# Patient Record
Sex: Female | Born: 1960 | Race: White | Hispanic: No | Marital: Married | Smoking: Former smoker
Health system: Southern US, Community
[De-identification: ages and names within clinical notes are randomized; demographics above are authoritative.]

## PROBLEM LIST (undated history)

## (undated) DIAGNOSIS — K219 Gastro-esophageal reflux disease without esophagitis: Secondary | ICD-10-CM

## (undated) DIAGNOSIS — Z973 Presence of spectacles and contact lenses: Secondary | ICD-10-CM

## (undated) DIAGNOSIS — F419 Anxiety disorder, unspecified: Secondary | ICD-10-CM

## (undated) DIAGNOSIS — E119 Type 2 diabetes mellitus without complications: Secondary | ICD-10-CM

## (undated) DIAGNOSIS — Z972 Presence of dental prosthetic device (complete) (partial): Secondary | ICD-10-CM

## (undated) DIAGNOSIS — E538 Deficiency of other specified B group vitamins: Secondary | ICD-10-CM

## (undated) DIAGNOSIS — M1990 Unspecified osteoarthritis, unspecified site: Secondary | ICD-10-CM

## (undated) DIAGNOSIS — L309 Dermatitis, unspecified: Secondary | ICD-10-CM

## (undated) DIAGNOSIS — M25569 Pain in unspecified knee: Secondary | ICD-10-CM

## (undated) DIAGNOSIS — I10 Essential (primary) hypertension: Secondary | ICD-10-CM

## (undated) DIAGNOSIS — E785 Hyperlipidemia, unspecified: Secondary | ICD-10-CM

## (undated) DIAGNOSIS — E663 Overweight: Secondary | ICD-10-CM

## (undated) HISTORY — DX: Hyperlipidemia, unspecified: E78.5

## (undated) HISTORY — DX: Pain in unspecified knee: M25.569

## (undated) HISTORY — DX: Deficiency of other specified B group vitamins: E53.8

## (undated) HISTORY — DX: Overweight: E66.3

## (undated) HISTORY — DX: Dermatitis, unspecified: L30.9

## (undated) HISTORY — DX: Anxiety disorder, unspecified: F41.9

## (undated) HISTORY — PX: BREAST BIOPSY: SHX20

## (undated) HISTORY — PX: BUNIONECTOMY: SHX129

## (undated) HISTORY — PX: ABDOMINAL HYSTERECTOMY: SHX81

---

## 1988-08-29 HISTORY — PX: PARTIAL HYSTERECTOMY: SHX80

## 2004-08-13 ENCOUNTER — Ambulatory Visit: Payer: Self-pay

## 2004-08-26 ENCOUNTER — Ambulatory Visit: Payer: Self-pay

## 2008-11-19 ENCOUNTER — Emergency Department: Payer: Self-pay | Admitting: Emergency Medicine

## 2010-08-17 ENCOUNTER — Ambulatory Visit: Payer: Self-pay | Admitting: Family Medicine

## 2010-08-27 ENCOUNTER — Encounter: Payer: Self-pay | Admitting: Family Medicine

## 2010-08-29 ENCOUNTER — Encounter: Payer: Self-pay | Admitting: Family Medicine

## 2010-09-01 ENCOUNTER — Ambulatory Visit: Payer: Self-pay | Admitting: Gastroenterology

## 2010-09-01 HISTORY — PX: COLONOSCOPY: SHX174

## 2010-09-01 LAB — HM COLONOSCOPY

## 2011-10-26 ENCOUNTER — Ambulatory Visit: Payer: Self-pay | Admitting: Family Medicine

## 2014-02-10 ENCOUNTER — Ambulatory Visit: Payer: Self-pay | Admitting: Family Medicine

## 2014-02-10 LAB — HM MAMMOGRAPHY: HM MAMMO: NORMAL

## 2014-11-21 LAB — HEMOGLOBIN A1C: HEMOGLOBIN A1C: 7.7 % — AB (ref 4.0–6.0)

## 2015-01-09 LAB — LIPID PANEL
CHOLESTEROL: 158 mg/dL (ref 0–200)
HDL: 30 mg/dL — AB (ref 35–70)
LDL CALC: 62 mg/dL
TRIGLYCERIDES: 312 mg/dL — AB (ref 40–160)

## 2015-01-22 ENCOUNTER — Other Ambulatory Visit: Payer: Self-pay | Admitting: Family Medicine

## 2015-01-22 DIAGNOSIS — R110 Nausea: Secondary | ICD-10-CM

## 2015-01-23 ENCOUNTER — Ambulatory Visit
Admission: RE | Admit: 2015-01-23 | Discharge: 2015-01-23 | Disposition: A | Discharge status: Home / Self Care | Payer: 59 | Source: Ambulatory Visit | Attending: Family Medicine | Admitting: Family Medicine

## 2015-01-23 DIAGNOSIS — R110 Nausea: Secondary | ICD-10-CM | POA: Insufficient documentation

## 2015-01-23 DIAGNOSIS — K760 Fatty (change of) liver, not elsewhere classified: Secondary | ICD-10-CM | POA: Insufficient documentation

## 2015-01-27 ENCOUNTER — Other Ambulatory Visit: Payer: Self-pay | Admitting: Family Medicine

## 2015-01-27 DIAGNOSIS — R140 Abdominal distension (gaseous): Secondary | ICD-10-CM

## 2015-01-28 ENCOUNTER — Other Ambulatory Visit: Payer: Self-pay | Admitting: Family Medicine

## 2015-01-28 DIAGNOSIS — R1010 Upper abdominal pain, unspecified: Secondary | ICD-10-CM

## 2015-01-28 MED ORDER — HYOSCYAMINE SULFATE 0.125 MG SL SUBL: 0.1250 mg | SUBLINGUAL_TABLET | SUBLINGUAL | Status: DC | PRN

## 2015-01-29 ENCOUNTER — Ambulatory Visit
Admission: RE | Admit: 2015-01-29 | Discharge: 2015-01-29 | Disposition: A | Discharge status: Home / Self Care | Payer: 59 | Source: Ambulatory Visit | Attending: Family Medicine | Admitting: Family Medicine

## 2015-01-29 ENCOUNTER — Telehealth: Payer: Self-pay

## 2015-01-29 DIAGNOSIS — I7090 Unspecified atherosclerosis: Secondary | ICD-10-CM | POA: Diagnosis not present

## 2015-01-29 DIAGNOSIS — R140 Abdominal distension (gaseous): Secondary | ICD-10-CM | POA: Diagnosis present

## 2015-01-29 DIAGNOSIS — E8889 Other specified metabolic disorders: Secondary | ICD-10-CM | POA: Insufficient documentation

## 2015-01-29 DIAGNOSIS — K228 Other specified diseases of esophagus: Secondary | ICD-10-CM | POA: Diagnosis not present

## 2015-01-29 HISTORY — DX: Type 2 diabetes mellitus without complications: E11.9

## 2015-01-29 MED ORDER — IOHEXOL 300 MG/ML  SOLN
100.0000 mL | Freq: Once | INTRAMUSCULAR | Status: AC | PRN
  Administered 2015-01-29: 100 mL via INTRAVENOUS

## 2015-01-29 NOTE — Telephone Encounter (Signed)
-----   Message from Steele Sizer, MD sent at 01/29/2015 10:54 AM EDT ----- She has fatty liver and also a thickening of distal esophagus, needs to follow up with GI for EGD. Please notify patient

## 2015-01-29 NOTE — Telephone Encounter (Signed)
Patient notified and CT results and told to follow up with [facility]. Also called in [facility] to [facility] for patient.

## 2015-02-03 ENCOUNTER — Ambulatory Visit: Payer: Self-pay | Admitting: Urgent Care

## 2015-02-03 ENCOUNTER — Ambulatory Visit: Payer: 59

## 2015-02-04 ENCOUNTER — Encounter: Payer: Self-pay | Admitting: Urgent Care

## 2015-02-04 ENCOUNTER — Ambulatory Visit: Payer: Self-pay | Admitting: Surgery

## 2015-02-04 ENCOUNTER — Ambulatory Visit (INDEPENDENT_AMBULATORY_CARE_PROVIDER_SITE_OTHER): Payer: 59 | Admitting: Urgent Care

## 2015-02-04 VITALS — BP 126/74 | HR 83 | Temp 98.1°F | Ht 64.0 in | Wt 159.2 lb

## 2015-02-04 DIAGNOSIS — R110 Nausea: Secondary | ICD-10-CM | POA: Diagnosis not present

## 2015-02-04 DIAGNOSIS — K921 Melena: Secondary | ICD-10-CM | POA: Insufficient documentation

## 2015-02-04 MED ORDER — PANTOPRAZOLE SODIUM 40 MG PO TBEC: 40.0000 mg | DELAYED_RELEASE_TABLET | Freq: Every day | ORAL | Status: DC

## 2015-02-04 MED ORDER — NA SULFATE-K SULFATE-MG SULF 17.5-3.13-1.6 GM/177ML PO SOLN: 1.0000 | ORAL | Status: DC

## 2015-02-04 MED ORDER — ONDANSETRON HCL 4 MG PO TABS: 4.0000 mg | ORAL_TABLET | Freq: Three times a day (TID) | ORAL | Status: DC | PRN

## 2015-02-04 NOTE — Assessment & Plan Note (Addendum)
She has chronic nausea and heartburn. CT suggests changes of the distal esophagus which could be due to acid reflux, esophagitis, Barrett's or malignancy. EGD with Dr. Allen Norris in [place]. I have discussed risks & benefits which include, but are not limited to, bleeding, infection, perforation & drug reaction.  The patient agrees with this plan & written consent will be obtained.    Begin pantoprazole 40 mg daily GERD diet Use Zofran as needed for nausea. Food Choices for Gastroesophageal Reflux Disease handout Take half of your diabetes medications the day prior to the procedure Hold diabetes medications day of procedure Bring all your medications and/or any insulin to the hospital the day of the procedure. Follow blood sugars, call us or your PCP if any problems.

## 2015-02-04 NOTE — Assessment & Plan Note (Signed)
Brandi Jordan is a pleasant 54 y.o. female with intermittent small volume chronic hematochezia. Last colonscopy in 2012.  Colonoscopy with Dr Allen Norris.  Differentials include colorectal carcinoma or polyp,  NSAID-induced enteropathy, inflammatory bowel disease versus benign anorectal source.  I have discussed risks & benefits which include, but are not limited to, bleeding, infection, perforation & drug reaction. The patient agrees with this plan & written consent will be obtained.

## 2015-02-04 NOTE — Progress Notes (Signed)
Gastroenterology Consultation  Referring Provider:     Steele Sizer, MD Primary Care Physician:  Loistine Chance, MD Primary Gastroenterologist:  Dr. Allen Norris     Reason for Consultation:     Chronic nausea, bloating & hematochezia        HPI:   Brandi Jordan is a 54 y.o. y/o female referred for consultation & management of chronic nausea, bloating & hematochezia by Loistine Chance, MD.  Patient states symptoms started about 2 months ago.  Pt is  Diabetic & was on insulin & metformin.  She went to her PCP & started bydureon for 6 weeks.  After 6th week, she had a lot of nausea & constipation. She stopped Bydureon about 4 weeks or more ago.   She tried stool softeners & they helped a little.  She has been on miralax 17grams every morning in her coffee.  Nausea persists.  Denies vomiting.  She has fatigue.  She has mid-abdominal pain that is constant & mild.  She has heartburn & belching with foul rotten egg odor.  Denies dysphgaia or odynophagia.  Only eating soup or crackers due to nausea.  She has loose stools 2-3 per day.  She saw bright red blood with wiping on tissue with BM.  Denies fever.  +chills.  No acute finding of the CT abdomen/ pelvis.  Mild circumferential thickening of the distal esophagus. Recommend correlation with a history of symptoms of GERD, and potentially referral for upper endoscopy.  Steatosis.  Atherosclerosis.  Labs reviewed from 01/09/15 shows a normal CBC, CMP with a glucose of 147. (Normal LFTs) 01/24/14 on phosphatase 125, otherwise normal LFTs, glucose 175, otherwise normal basic metabolic panel. Lipase 48. TSH normal. B12 227.  FA 12.3. Vitamin D 29.2.  Uses phenergan & levsin with some relief.  Occasional TUMS with minimal relief.  Rare Aleve for Headaches.  Past Medical History  Diagnosis Date  . Diabetes mellitus without complication   . Knee pain   . Anxiety   . Dermatitis   . Overweight   . Dyslipidemia   . B12 deficiency     Past Surgical History    Procedure Laterality Date  . Colonoscopy  09/01/2010    Iftikhar-diverticulosis only  . Partial hysterectomy  1990    Partial    Prior to Admission medications   Medication Sig Start Date End Date Taking? Authorizing Provider  Albiglutide 30 MG PEN Inject 1 Units into the skin once a week.    Historical Provider, MD  ALPRAZolam Duanne Moron) 0.5 MG tablet Take 1 tablet by mouth daily as needed. 11/21/14   Historical Provider, MD  aspirin 81 MG tablet Take 81 mg by mouth daily.    Historical Provider, MD  hyoscyamine (LEVSIN SL) 0.125 MG SL tablet Take 1 tablet by mouth as needed. 01/29/15   Historical Provider, MD  metFORMIN (GLUCOPHAGE) 1000 MG tablet Take 1,000 mg by mouth 1 day or 1 dose.    Historical Provider, MD  promethazine (PHENERGAN) 12.5 MG tablet Take 1 tablet by mouth every 6 (six) hours as needed. 01/08/15   Historical Provider, MD    Family History  Problem Relation Age of Onset  . Cholecystitis Mother   . Kidney failure Mother   . Cancer Father     lung  . Leukemia Sister     chronic lymphocytic  . Colon cancer Maternal Aunt 45  . Liver disease Neg Hx     History   Social History Narrative  Lives with husband, 1 healthy grown daughter, works in Therapist, art at [employer]   History  Substance Use Topics  . Smoking status: Not on file  . Smokeless tobacco: Not on file  . Alcohol Use: No    Allergies as of 02/04/2015 - Review Complete 02/04/2015  Allergen Reaction Noted  . Effexor xr [venlafaxine hcl er] Nausea And Vomiting and Other (See Comments) 02/02/2015  . Wilder Glade [dapagliflozin] Other (See Comments) 02/02/2015  . Invokana [canagliflozin] Other (See Comments) 02/02/2015  . Kombiglyze [saxagliptin-metformin er] Diarrhea and Other (See Comments) 02/02/2015  . Amoxicillin Rash 01/29/2015    Review of Systems:    All systems reviewed and negative except where noted in HPI.   Physical Exam:  BP 126/74 mmHg  Pulse 83  Temp(Src) 98.1 F (36.7 C) (Oral)   Ht 5\' 4"  (1.626 m)  Wt 159 lb 3.2 oz (72.213 kg)  BMI 27.31 kg/m2 No LMP recorded. Patient has had a hysterectomy. Psych:  Alert and cooperative. Normal mood and affect. General:   Alert,  Well-developed, well-nourished, pleasant and cooperative in NAD Head:  Normocephalic and atraumatic. Eyes:  Sclera clear, no icterus.   Conjunctiva pink. Ears:  Normal auditory acuity. Nose:  No deformity, discharge, or lesions. Mouth:  No deformity or lesions,oropharynx pink & moist. Neck:  Supple; no masses or thyromegaly. Lungs:  Respirations even and unlabored.  Clear throughout to auscultation.   No wheezes, crackles, or rhonchi. No acute distress. Heart:  Regular rate and rhythm; no murmurs, clicks, rubs, or gallops. Abdomen:  Normal bowel sounds.  No bruits.  Soft, non-tender and non-distended without masses, hepatosplenomegaly or hernias noted.  No guarding or rebound tenderness.  Negative Carnett sign.   Rectal:  Deferred.  Msk:  Symmetrical without gross deformities.  Good, equal movement & strength bilaterally. Pulses:  Normal pulses noted. Extremities:  No clubbing or edema.  No cyanosis. Neurologic:  Alert and oriented x3;  grossly normal neurologically. Skin:  Intact without significant lesions or rashes.  No jaundice. Lymph Nodes:  No significant cervical adenopathy. Psych:  Alert and cooperative. Normal mood and affect.  Imaging Studies: Ct Abdomen Pelvis W Contrast  01/29/2015   CLINICAL DATA:  54 year old female with a history of severe nausea and abdominal cramps.  EXAM: CT ABDOMEN AND PELVIS WITH CONTRAST  TECHNIQUE: Multidetector CT imaging of the abdomen and pelvis was performed using the standard protocol following bolus administration of intravenous contrast.  CONTRAST:  147mL OMNIPAQUE IOHEXOL 300 MG/ML  SOLN  COMPARISON:  None.  FINDINGS: Lower chest:  Unremarkable appearance of the superficial soft tissues.  Visualized heart unremarkable.  No pericardial fluid/thickening.  Mild  circumferential thickening of the distal esophagus. Small hiatal hernia.  No confluent airspace disease.  No pleural effusion.  Abdomen:  Unremarkable appearance of the superficial soft tissues.  Diffusely decreased attenuation of the liver parenchyma. Focal fatty sparing at the gallbladder fossa.  Unremarkable spleen.  Unremarkable adrenal glands.  Unremarkable pancreas.  Unremarkable gallbladder.  Unremarkable bilateral kidneys.  Unremarkable course of the bilateral ureters.  Unremarkable urinary bladder.  No abnormally distended small bowel or colon. No transition point. Normal appendix.  Multiple colonic diverticula.  No associated inflammatory changes.  Surgical changes of hysterectomy. Unremarkable appearance of the right adnexa.  New calcifications of the abdominal aorta. No aneurysm or dissection flap.  No abdominal free fluid, free air, or peritoneal lymphadenopathy.  No displaced fracture. Minimal degenerative changes of the spine. No bony canal narrowing.  Pseudoarticulation of the right L5  transverse process with the sacrum.  IMPRESSION: No acute finding of the abdomen/ pelvis.  Mild circumferential thickening of the distal esophagus. Recommend correlation with a history of symptoms of GERD, and potentially referral for upper endoscopy.  Steatosis.  Atherosclerosis.  Signed,  Dulcy Fanny. Earleen Newport, DO  [facility]  [facility]   Electronically Signed   By: Corrie Mckusick D.O.   On: 01/29/2015 09:15   US Abdomen Limited  01/23/2015   CLINICAL DATA:  Nausea for 3 weeks.  EXAM: US ABDOMEN LIMITED - RIGHT UPPER QUADRANT  COMPARISON:  11/19/2008  FINDINGS: Gallbladder:  No gallstones or wall thickening visualized. No sonographic Murphy sign noted.  Common bile duct:  Diameter: Normal caliber, 5 mm  Liver:  Increased echotexture throughout the liver, similar to prior study compatible with fatty infiltration. No focal abnormality visualized.  IMPRESSION: Stable fatty  liver.  No acute findings.   Electronically Signed   By: Rolm Baptise M.D.   On: 01/23/2015 11:00

## 2015-02-04 NOTE — Patient Instructions (Addendum)
You will need an EGD & colonoscopy with Dr. Allen Norris in [facility] Begin pantoprazole 40 mg daily GERD diet Use Zofran as needed for nausea. Food Choices for Gastroesophageal Reflux Disease When you have gastroesophageal reflux disease (GERD), the foods you eat and your eating habits are very important. Choosing the right foods can help ease the discomfort of GERD. WHAT GENERAL GUIDELINES DO I NEED TO FOLLOW?  Choose fruits, vegetables, whole grains, low-fat dairy products, and low-fat meat, fish, and poultry.  Limit fats such as oils, salad dressings, butter, nuts, and avocado.  Keep a food diary to identify foods that cause symptoms.  Avoid foods that cause reflux. These may be different for different people.  Eat frequent small meals instead of three large meals each day.  Eat your meals slowly, in a relaxed setting.  Limit fried foods.  Cook foods using methods other than frying.  Avoid drinking alcohol.  Avoid drinking large amounts of liquids with your meals.  Avoid bending over or lying down until 2-3 hours after eating. WHAT FOODS ARE NOT RECOMMENDED? The following are some foods and drinks that may worsen your symptoms: Vegetables Tomatoes. Tomato juice. Tomato and spaghetti sauce. Chili peppers. Onion and garlic. Horseradish. Fruits Oranges, grapefruit, and lemon (fruit and juice). Meats High-fat meats, fish, and poultry. This includes hot dogs, ribs, ham, sausage, salami, and bacon. Dairy Whole milk and chocolate milk. Sour cream. Cream. Butter. Ice cream. Cream cheese.  Beverages Coffee and tea, with or without caffeine. Carbonated beverages or energy drinks. Condiments Hot sauce. Barbecue sauce.  Sweets/Desserts Chocolate and cocoa. Donuts. Peppermint and spearmint. Fats and Oils High-fat foods, including [place] fries and potato chips. Other Vinegar. Strong spices, such as black pepper, white pepper, red pepper, cayenne, curry powder, cloves, ginger, and chili  powder. The items listed above may not be a complete list of foods and beverages to avoid. Contact your dietitian for more information. Document Released: 08/15/2005 Document Revised: 08/20/2013 Document Reviewed: 06/19/2013 [facility] Patient Information 2015 [place], [place]. This information is not intended to replace advice given to you by your health care provider. Make sure you discuss any questions you have with your health care provider.

## 2015-02-05 ENCOUNTER — Other Ambulatory Visit: Payer: Self-pay | Admitting: Urgent Care

## 2015-02-13 ENCOUNTER — Telehealth: Payer: Self-pay | Admitting: Urgent Care

## 2015-02-13 NOTE — Telephone Encounter (Signed)
Pt has called stating that she did not get instruction on Colonoscopy or Rx. Please contact pt about instructions. She also has questions about EGD.

## 2015-02-13 NOTE — Telephone Encounter (Signed)
Spoke with patient at this time and she explains that [place] told her that she wanted her to have a Colonscopy as well with planned EGD.  Read through notes that Brennan Bailey had completed night after visit and it confirms that she also wanted to add Colonoscopy on to EGD procedure scheduled 03/03/15.   Will resend appropriate paperwork out to patient. Address verified. Answered all questions while on the phone and verified that suprep was sent to pharmacy.  Pt will call back with any further questions.

## 2015-02-17 ENCOUNTER — Other Ambulatory Visit: Payer: Self-pay | Admitting: Family Medicine

## 2015-02-19 ENCOUNTER — Ambulatory Visit (INDEPENDENT_AMBULATORY_CARE_PROVIDER_SITE_OTHER): Payer: 59

## 2015-02-19 DIAGNOSIS — E538 Deficiency of other specified B group vitamins: Secondary | ICD-10-CM | POA: Diagnosis not present

## 2015-02-19 MED ORDER — CYANOCOBALAMIN 1000 MCG/ML IJ SOLN
1000.0000 ug | Freq: Once | INTRAMUSCULAR | Status: AC
  Administered 2015-02-19: 1000 ug via INTRAMUSCULAR

## 2015-02-19 MED ORDER — CYANOCOBALAMIN 1000 MCG/ML IJ SOLN: 1000.0000 ug | Freq: Once | INTRAMUSCULAR | Status: DC

## 2015-02-23 ENCOUNTER — Other Ambulatory Visit: Payer: Self-pay | Admitting: Urgent Care

## 2015-02-23 ENCOUNTER — Other Ambulatory Visit: Payer: Self-pay

## 2015-03-03 ENCOUNTER — Encounter: Admission: RE | Payer: Self-pay | Source: Ambulatory Visit

## 2015-03-03 ENCOUNTER — Ambulatory Visit: Payer: 59 | Admitting: Anesthesiology

## 2015-03-03 ENCOUNTER — Ambulatory Visit: Admission: RE | Admit: 2015-03-03 | Payer: 59 | Source: Ambulatory Visit | Admitting: Gastroenterology

## 2015-03-03 ENCOUNTER — Ambulatory Visit
Admission: RE | Admit: 2015-03-03 | Discharge: 2015-03-03 | Disposition: A | Discharge status: Home / Self Care | Payer: 59 | Source: Ambulatory Visit | Attending: Gastroenterology | Admitting: Gastroenterology

## 2015-03-03 ENCOUNTER — Encounter
Admission: RE | Disposition: A | Discharge status: Home / Self Care | Payer: Self-pay | Source: Ambulatory Visit | Attending: Gastroenterology

## 2015-03-03 DIAGNOSIS — K2970 Gastritis, unspecified, without bleeding: Secondary | ICD-10-CM

## 2015-03-03 DIAGNOSIS — R110 Nausea: Secondary | ICD-10-CM | POA: Diagnosis not present

## 2015-03-03 DIAGNOSIS — Z888 Allergy status to other drugs, medicaments and biological substances status: Secondary | ICD-10-CM | POA: Insufficient documentation

## 2015-03-03 DIAGNOSIS — Z79899 Other long term (current) drug therapy: Secondary | ICD-10-CM | POA: Insufficient documentation

## 2015-03-03 DIAGNOSIS — Z90710 Acquired absence of both cervix and uterus: Secondary | ICD-10-CM | POA: Diagnosis not present

## 2015-03-03 DIAGNOSIS — Z801 Family history of malignant neoplasm of trachea, bronchus and lung: Secondary | ICD-10-CM | POA: Diagnosis not present

## 2015-03-03 DIAGNOSIS — Z8489 Family history of other specified conditions: Secondary | ICD-10-CM | POA: Diagnosis not present

## 2015-03-03 DIAGNOSIS — Z87891 Personal history of nicotine dependence: Secondary | ICD-10-CM | POA: Diagnosis not present

## 2015-03-03 DIAGNOSIS — Z7982 Long term (current) use of aspirin: Secondary | ICD-10-CM | POA: Insufficient documentation

## 2015-03-03 DIAGNOSIS — E663 Overweight: Secondary | ICD-10-CM | POA: Diagnosis not present

## 2015-03-03 DIAGNOSIS — K640 First degree hemorrhoids: Secondary | ICD-10-CM | POA: Insufficient documentation

## 2015-03-03 DIAGNOSIS — Z6827 Body mass index (BMI) 27.0-27.9, adult: Secondary | ICD-10-CM | POA: Insufficient documentation

## 2015-03-03 DIAGNOSIS — Z800 Family history of malignant neoplasm of digestive organs: Secondary | ICD-10-CM | POA: Insufficient documentation

## 2015-03-03 DIAGNOSIS — E785 Hyperlipidemia, unspecified: Secondary | ICD-10-CM | POA: Insufficient documentation

## 2015-03-03 DIAGNOSIS — Z806 Family history of leukemia: Secondary | ICD-10-CM | POA: Insufficient documentation

## 2015-03-03 DIAGNOSIS — K5790 Diverticulosis of intestine, part unspecified, without perforation or abscess without bleeding: Secondary | ICD-10-CM | POA: Diagnosis not present

## 2015-03-03 DIAGNOSIS — F419 Anxiety disorder, unspecified: Secondary | ICD-10-CM | POA: Insufficient documentation

## 2015-03-03 DIAGNOSIS — K449 Diaphragmatic hernia without obstruction or gangrene: Secondary | ICD-10-CM | POA: Diagnosis not present

## 2015-03-03 DIAGNOSIS — Z841 Family history of disorders of kidney and ureter: Secondary | ICD-10-CM | POA: Diagnosis not present

## 2015-03-03 DIAGNOSIS — E119 Type 2 diabetes mellitus without complications: Secondary | ICD-10-CM | POA: Insufficient documentation

## 2015-03-03 DIAGNOSIS — K921 Melena: Secondary | ICD-10-CM | POA: Diagnosis not present

## 2015-03-03 DIAGNOSIS — Z881 Allergy status to other antibiotic agents status: Secondary | ICD-10-CM | POA: Insufficient documentation

## 2015-03-03 DIAGNOSIS — D122 Benign neoplasm of ascending colon: Secondary | ICD-10-CM

## 2015-03-03 DIAGNOSIS — E538 Deficiency of other specified B group vitamins: Secondary | ICD-10-CM | POA: Diagnosis not present

## 2015-03-03 DIAGNOSIS — K2950 Unspecified chronic gastritis without bleeding: Secondary | ICD-10-CM | POA: Diagnosis not present

## 2015-03-03 HISTORY — PX: ESOPHAGOGASTRODUODENOSCOPY (EGD) WITH PROPOFOL: SHX5813

## 2015-03-03 HISTORY — PX: COLONOSCOPY WITH PROPOFOL: SHX5780

## 2015-03-03 LAB — GLUCOSE, CAPILLARY: GLUCOSE-CAPILLARY: 190 mg/dL — AB (ref 65–99)

## 2015-03-03 SURGERY — COLONOSCOPY WITH PROPOFOL
Anesthesia: Choice

## 2015-03-03 SURGERY — ESOPHAGOGASTRODUODENOSCOPY (EGD) WITH PROPOFOL
Anesthesia: General

## 2015-03-03 SURGERY — ESOPHAGOGASTRODUODENOSCOPY (EGD) WITH PROPOFOL
Anesthesia: Choice

## 2015-03-03 SURGERY — COLONOSCOPY WITH PROPOFOL
Anesthesia: General

## 2015-03-03 MED ORDER — LIDOCAINE HCL (CARDIAC) 20 MG/ML IV SOLN
INTRAVENOUS | Status: DC | PRN
  Administered 2015-03-03: 100 mg via INTRAVENOUS

## 2015-03-03 MED ORDER — SODIUM CHLORIDE 0.9 % IV SOLN
INTRAVENOUS | Status: DC
  Administered 2015-03-03: 1000 mL via INTRAVENOUS

## 2015-03-03 MED ORDER — MIDAZOLAM HCL 2 MG/2ML IJ SOLN
INTRAMUSCULAR | Status: DC | PRN
  Administered 2015-03-03: 2 mg via INTRAVENOUS

## 2015-03-03 MED ORDER — PROPOFOL INFUSION 10 MG/ML OPTIME
INTRAVENOUS | Status: DC | PRN
  Administered 2015-03-03: 160 ug/kg/min via INTRAVENOUS

## 2015-03-03 NOTE — Anesthesia Postprocedure Evaluation (Signed)
  Anesthesia Post-op Note  Patient: Brandi Jordan  Procedure(s) Performed: Procedure(s): COLONOSCOPY WITH PROPOFOL (N/A) ESOPHAGOGASTRODUODENOSCOPY (EGD) WITH PROPOFOL (N/A)  Anesthesia type:General  Patient location: PACU  Post pain: Pain level controlled  Post assessment: Post-op Vital signs reviewed, Patient's Cardiovascular Status Stable, Respiratory Function Stable, Patent Airway and No signs of Nausea or vomiting  Post vital signs: Reviewed and stable  Last Vitals:  Filed Vitals:   03/03/15 0923  BP: 137/84  Pulse: 86  Temp: 37 C  Resp: 17    Level of consciousness: awake, alert  and patient cooperative  Complications: No apparent anesthesia complications

## 2015-03-03 NOTE — Transfer of Care (Signed)
Immediate Anesthesia Transfer of Care Note  Patient: Brandi Jordan  Procedure(s) Performed: Procedure(s): COLONOSCOPY WITH PROPOFOL (N/A) ESOPHAGOGASTRODUODENOSCOPY (EGD) WITH PROPOFOL (N/A)  Patient Location: PACU and Endoscopy Unit  Anesthesia Type:General  Level of Consciousness: awake, alert  and oriented  Airway & Oxygen Therapy: Patient Spontanous Breathing and Patient connected to nasal cannula oxygen  Post-op Assessment: Report given to RN and Post -op Vital signs reviewed and stable  Post vital signs: Reviewed and stable  Last Vitals: 99% 88hr 98/69 20r  97 temp Filed Vitals:   03/03/15 0923  BP: 137/84  Pulse: 86  Temp: 37 C  Resp: 17    Complications: No apparent anesthesia complications

## 2015-03-03 NOTE — Anesthesia Preprocedure Evaluation (Signed)
Anesthesia Evaluation  Patient identified by MRN, date of birth, ID band Patient awake    Reviewed: Allergy & Precautions, NPO status , Patient's Chart, lab work & pertinent test results  Airway Mallampati: III  TM Distance: <3 FB Neck ROM: Full    Dental  (+) Caps   Pulmonary former smoker,  breath sounds clear to auscultation  Pulmonary exam normal       Cardiovascular negative cardio ROS Normal cardiovascular exam    Neuro/Psych Anxiety negative neurological ROS     GI/Hepatic Neg liver ROS, hematochezia   Endo/Other  diabetes, Well Controlled, Type 2  Renal/GU negative Renal ROS  negative genitourinary   Musculoskeletal negative musculoskeletal ROS (+)   Abdominal Normal abdominal exam  (+)   Peds negative pediatric ROS (+)  Hematology hematochezia   Anesthesia Other Findings   Reproductive/Obstetrics                             Anesthesia Physical Anesthesia Plan  ASA: II  Anesthesia Plan: General   Post-op Pain Management:    Induction: Intravenous  Airway Management Planned: Nasal Cannula  Additional Equipment:   Intra-op Plan:   Post-operative Plan:   Informed Consent: I have reviewed the patients History and Physical, chart, labs and discussed the procedure including the risks, benefits and alternatives for the proposed anesthesia with the patient or authorized representative who has indicated his/her understanding and acceptance.   Dental advisory given  Plan Discussed with: CRNA and Surgeon  Anesthesia Plan Comments:         Anesthesia Quick Evaluation

## 2015-03-03 NOTE — Op Note (Signed)
[facility]astroenterology Patient Name: Brandi Jordan Procedure Date: 03/03/2015 10:07 AM MRN: [id number] Account #: [id number] Date of Birth: Feb 27, 1961 Admit Type: Outpatient Age: 54 Room: [facility] ENDO ROOM 4 Gender: Female Note Status: Finalized Procedure:         Upper GI endoscopy Indications:       Nausea Providers:         Lucilla Lame, MD Referring MD:      Bethena Roys. Sowles, MD (Referring MD) Medicines:         Propofol per Anesthesia Complications:     No immediate complications. Procedure:         Pre-Anesthesia Assessment:                    - Prior to the procedure, a History and Physical was                     performed, and patient medications and allergies were                     reviewed. The patient's tolerance of previous anesthesia                     was also reviewed. The risks and benefits of the procedure                     and the sedation options and risks were discussed with the                     patient. All questions were answered, and informed consent                     was obtained. Prior Anticoagulants: The patient has taken                     no previous anticoagulant or antiplatelet agents. ASA                     Grade Assessment: II - A patient with mild systemic                     disease. After reviewing the risks and benefits, the                     patient was deemed in satisfactory condition to undergo                     the procedure.                    After obtaining informed consent, the endoscope was passed                     under direct vision. Throughout the procedure, the                     patient's blood pressure, pulse, and oxygen saturations                     were monitored continuously. The Olympus GIF-140 endoscope                     (S#: [id number]) was introduced through the mouth, and  advanced to the second part of duodenum. The upper GI                     endoscopy was  accomplished without difficulty. The patient                     tolerated the procedure well. Findings:      A small hiatus hernia was present.      Localized minimal inflammation characterized by erythema was found in       the gastric antrum. Biopsies were taken with a cold forceps for       histology.      The examined duodenum was normal. Impression:        - Small hiatus hernia.                    - Gastritis. Biopsied.                    - Normal examined duodenum. Recommendation:    - Await pathology results.                    - Perform a colonoscopy today. Procedure Code(s): --- Professional ---                    [phone], Esophagogastroduodenoscopy, flexible, transoral;                     with biopsy, single or multiple Diagnosis Code(s): --- Professional ---                    R11.0, Nausea                    K29.70, Gastritis, unspecified, without bleeding CPT copyright 2014 American Medical Association. All rights reserved. The codes documented in this report are preliminary and upon coder review may  be revised to meet current compliance requirements. Lucilla Lame, MD 03/03/2015 10:16:15 AM This report has been signed electronically. Number of Addenda: 0 Note Initiated On: 03/03/2015 10:07 AM      [facility]

## 2015-03-03 NOTE — H&P (Signed)
[facility]  [address] [place], [place] [address] Phone: [phone] Fax : [phone]  Primary Care Physician:  Loistine Chance, MD Primary Gastroenterologist:  Dr. Allen Norris  Pre-Procedure History & Physical: HPI:  Brandi Jordan is a 54 y.o. female is here for an endoscopy and colonoscopy.   Past Medical History  Diagnosis Date  . Diabetes mellitus without complication   . Knee pain   . Anxiety   . Dermatitis   . Overweight   . Dyslipidemia   . B12 deficiency     Past Surgical History  Procedure Laterality Date  . Colonoscopy  09/01/2010    Iftikhar-diverticulosis only  . Partial hysterectomy  1990    Partial  . Abdominal hysterectomy      Prior to Admission medications   Medication Sig Start Date End Date Taking? Authorizing Provider  metFORMIN (GLUCOPHAGE) 1000 MG tablet Take 1,000 mg by mouth 1 day or 1 dose.   Yes Historical Provider, MD  Na Sulfate-K Sulfate-Mg Sulf (SUPREP BOWEL PREP) SOLN Take 1 kit by mouth as directed. 02/04/15  Yes Andria Meuse, NP  ALPRAZolam Duanne Moron) 0.5 MG tablet Take 1 tablet by mouth daily as needed. 11/21/14   Historical Provider, MD  aspirin 81 MG tablet Take 81 mg by mouth daily.    Historical Provider, MD  hyoscyamine (LEVSIN SL) 0.125 MG SL tablet Take 1 tablet by mouth as needed. 01/29/15   Historical Provider, MD  metformin (FORTAMET) 1000 MG (OSM) 24 hr tablet Take 1 tablet by mouth  every evening 02/17/15   Steele Sizer, MD  ondansetron (ZOFRAN) 4 MG tablet Take 1 tablet (4 mg total) by mouth every 8 (eight) hours as needed for nausea or vomiting. 02/04/15   Andria Meuse, NP  pantoprazole (PROTONIX) 40 MG tablet Take 1 tablet (40 mg total) by mouth daily. 02/04/15   Andria Meuse, NP  promethazine (PHENERGAN) 12.5 MG tablet Take 1 tablet by mouth every 6 (six) hours as needed. 01/08/15   Historical Provider, MD    Allergies as of 02/23/2015 - Review Complete 02/04/2015  Allergen Reaction Noted  . Effexor  xr [venlafaxine hcl er] Nausea And Vomiting and Other (See Comments) 02/02/2015  . Wilder Glade [dapagliflozin] Other (See Comments) 02/02/2015  . Invokana [canagliflozin] Other (See Comments) 02/02/2015  . Kombiglyze [saxagliptin-metformin er] Diarrhea and Other (See Comments) 02/02/2015  . Amoxicillin Rash 01/29/2015    Family History  Problem Relation Age of Onset  . Cholecystitis Mother   . Kidney failure Mother   . Cancer Father     lung  . Leukemia Sister     chronic lymphocytic  . Colon cancer Maternal Aunt 66  . Liver disease Neg Hx     History   Social History  . Marital Status: Married    Spouse Name: N/A  . Number of Children: 1  . Years of Education: N/A   Occupational History  . customer service [employer] History Main Topics  . Smoking status: Former Smoker    Quit date: 06/03/2010  . Smokeless tobacco: Not on file  . Alcohol Use: No  . Drug Use: No  . Sexual Activity: Not on file   Other Topics Concern  . 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 Neck:  Supple; no masses or thyromegaly. Lungs:  Clear throughout to auscultation.    Heart:  Regular rate and rhythm. Abdomen:  Soft, nontender and nondistended. Normal bowel sounds, without guarding, and without rebound.   Neurologic:  Alert and  oriented x4;  grossly normal neurologically.  Impression/Plan: Michelene Gardener is here for an endoscopy and colonoscopy to be performed for nausea and hematochezia  Risks, benefits, limitations, and alternatives regarding  endoscopy and colonoscopy have been reviewed with  the patient.  Questions have been answered.  All parties agreeable.   Ollen Bowl, MD  03/03/2015, 10:04 AM

## 2015-03-03 NOTE — Op Note (Signed)
[facility] Gastroenterology Patient Name: Brandi Jordan Procedure Date: 03/03/2015 10:06 AM MRN: [id number] Account #: [id number] Date of Birth: [birth year] Admit Type: Outpatient Age: 54 Room: [facility] ENDO ROOM 4 Gender: Female Note Status: Finalized Procedure:         Colonoscopy Indications:       Hematochezia Providers:         Lucilla Lame, MD Referring MD:      Bethena Roys. Sowles, MD (Referring MD) Medicines:         Propofol per Anesthesia Complications:     No immediate complications. Procedure:         Pre-Anesthesia Assessment:                    - Prior to the procedure, a History and Physical was                     performed, and patient medications and allergies were                     reviewed. The patient's tolerance of previous anesthesia                     was also reviewed. The risks and benefits of the procedure                     and the sedation options and risks were discussed with the                     patient. All questions were answered, and informed consent                     was obtained. Prior Anticoagulants: The patient has taken                     no previous anticoagulant or antiplatelet agents. ASA                     Grade Assessment: II - A patient with mild systemic                     disease. After reviewing the risks and benefits, the                     patient was deemed in satisfactory condition to undergo                     the procedure.                    After obtaining informed consent, the colonoscope was                     passed under direct vision. Throughout the procedure, the                     patient's blood pressure, pulse, and oxygen saturations                     were monitored continuously. The Colonoscope was                     introduced through the anus and advanced to the the cecum,  identified by appendiceal orifice and ileocecal valve. The                     colonoscopy was  performed without difficulty. The patient                     tolerated the procedure well. The quality of the bowel                     preparation was excellent. Findings:      The perianal and digital rectal examinations were normal.      A 3 mm polyp was found in the ascending colon. The polyp was sessile.       The polyp was removed with a cold biopsy forceps. Resection and       retrieval were complete.      Non-bleeding internal hemorrhoids were found during retroflexion. The       hemorrhoids were Grade I (internal hemorrhoids that do not prolapse).      A few small-mouthed diverticula were found in the entire colon. Impression:        - One 3 mm polyp in the ascending colon. Resected and                     retrieved.                    - Non-bleeding internal hemorrhoids.                    - Diverticulosis in the entire examined colon. Recommendation:    - High fiber diet. Procedure Code(s): --- Professional ---                    [id number], Colonoscopy, flexible; with biopsy, single or                     multiple Diagnosis Code(s): --- Professional ---                    K92.1, Melena                    D12.2, Benign neoplasm of ascending colon CPT copyright 2014 American Medical Association. All rights reserved. The codes documented in this report are preliminary and upon coder review may  be revised to meet current compliance requirements. Lucilla Lame, MD 03/03/2015 10:34:58 AM This report has been signed electronically. Number of Addenda: 0 Note Initiated On: 03/03/2015 10:06 AM Scope Withdrawal Time: 0 hours 10 minutes 15 seconds  Total Procedure Duration: 0 hours 12 minutes 23 seconds       [facility]

## 2015-03-04 ENCOUNTER — Encounter: Payer: Self-pay | Admitting: Gastroenterology

## 2015-03-04 LAB — SURGICAL PATHOLOGY

## 2015-03-05 ENCOUNTER — Ambulatory Visit (INDEPENDENT_AMBULATORY_CARE_PROVIDER_SITE_OTHER): Payer: 59 | Admitting: Family Medicine

## 2015-03-05 ENCOUNTER — Encounter: Payer: Self-pay | Admitting: Family Medicine

## 2015-03-05 ENCOUNTER — Encounter (INDEPENDENT_AMBULATORY_CARE_PROVIDER_SITE_OTHER): Payer: Self-pay

## 2015-03-05 VITALS — BP 110/64 | HR 80 | Temp 99.0°F | Resp 14 | Ht 64.0 in | Wt 162.5 lb

## 2015-03-05 DIAGNOSIS — Z1239 Encounter for other screening for malignant neoplasm of breast: Secondary | ICD-10-CM

## 2015-03-05 DIAGNOSIS — F411 Generalized anxiety disorder: Secondary | ICD-10-CM | POA: Insufficient documentation

## 2015-03-05 DIAGNOSIS — E538 Deficiency of other specified B group vitamins: Secondary | ICD-10-CM | POA: Insufficient documentation

## 2015-03-05 DIAGNOSIS — E1165 Type 2 diabetes mellitus with hyperglycemia: Secondary | ICD-10-CM

## 2015-03-05 DIAGNOSIS — K648 Other hemorrhoids: Secondary | ICD-10-CM | POA: Insufficient documentation

## 2015-03-05 DIAGNOSIS — Z0000 Encounter for general adult medical examination without abnormal findings: Secondary | ICD-10-CM | POA: Diagnosis not present

## 2015-03-05 DIAGNOSIS — Z124 Encounter for screening for malignant neoplasm of cervix: Secondary | ICD-10-CM | POA: Diagnosis not present

## 2015-03-05 DIAGNOSIS — Z1211 Encounter for screening for malignant neoplasm of colon: Secondary | ICD-10-CM

## 2015-03-05 DIAGNOSIS — Z01419 Encounter for gynecological examination (general) (routine) without abnormal findings: Secondary | ICD-10-CM

## 2015-03-05 DIAGNOSIS — K2970 Gastritis, unspecified, without bleeding: Secondary | ICD-10-CM

## 2015-03-05 DIAGNOSIS — IMO0002 Reserved for concepts with insufficient information to code with codable children: Secondary | ICD-10-CM | POA: Insufficient documentation

## 2015-03-05 DIAGNOSIS — E785 Hyperlipidemia, unspecified: Secondary | ICD-10-CM | POA: Insufficient documentation

## 2015-03-05 DIAGNOSIS — E1140 Type 2 diabetes mellitus with diabetic neuropathy, unspecified: Secondary | ICD-10-CM | POA: Insufficient documentation

## 2015-03-05 MED ORDER — PANTOPRAZOLE SODIUM 40 MG PO TBEC: 40.0000 mg | DELAYED_RELEASE_TABLET | Freq: Every day | ORAL | Status: DC

## 2015-03-05 NOTE — Progress Notes (Signed)
Name: Brandi Jordan   MRN: [id number]    DOB: January 28, 1961   Date:03/05/2015       Progress Note  Subjective  Chief Complaint  Chief Complaint  Patient presents with  . Annual Exam    HPI  Well Woman exam: she is feeling better, had colonoscopy and EGD this week, still has some abdominal bloating, otherwise feeling fine. Appetite is back to normal. DMII, she never started Tanzeum , she is taking metformin only and glucose has been high since colonoscopy. She states occasionally has urge incontinence. She has not been sexually active because of husband's back pain.   Patient Active Problem List   Diagnosis Date Noted  . Internal hemorrhoids without complication 54/00/8676  . Diabetes type 2, controlled 03/05/2015  . Anxiety, generalized 03/05/2015  . B12 deficiency 03/05/2015  . Hyperlipidemia 03/05/2015  . Gastritis   . Benign neoplasm of ascending colon     Past Surgical History  Procedure Laterality Date  . Colonoscopy  09/01/2010    Iftikhar-diverticulosis only  . Partial hysterectomy  1990    Partial  . Abdominal hysterectomy    . Colonoscopy with propofol N/A 03/03/2015    Procedure: COLONOSCOPY WITH PROPOFOL;  Surgeon: Lucilla Lame, MD;  Location: [facility] ENDOSCOPY;  Service: Endoscopy;  Laterality: N/A;  . Esophagogastroduodenoscopy (egd) with propofol N/A 03/03/2015    Procedure: ESOPHAGOGASTRODUODENOSCOPY (EGD) WITH PROPOFOL;  Surgeon: Lucilla Lame, MD;  Location: [facility] ENDOSCOPY;  Service: Endoscopy;  Laterality: N/A;    Family History  Problem Relation Age of Onset  . Cholecystitis Mother   . Kidney failure Mother   . Cancer Father     lung  . Leukemia Sister     chronic lymphocytic  . Colon cancer Maternal Aunt 20  . Liver disease Neg Hx     History   Social History  . Marital Status: Married    Spouse Name: N/A  . Number of Children: 1  . Years of Education: N/A   Occupational History  . 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 Smoking  status: Former Smoker -- 35 years    Types: Cigarettes    Quit date: 06/03/2010  . Smokeless tobacco: Not on file  . Alcohol Use: No  . Drug Use: No  . Sexual Activity: Not Currently   Other Topics Concern  . 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  ALPRAZolam (XANAX) 0.5 MG tablet, Take 1 tablet by mouth daily as needed., Disp: , Rfl:  .  aspirin 81 MG tablet, Take 81 mg by mouth daily., Disp: , Rfl:  .  metformin (FORTAMET) 1000 MG (OSM) 24 hr tablet, Take 1 tablet by mouth  every evening, Disp: 90 tablet, Rfl: 0 .  pantoprazole (PROTONIX) 40 MG tablet, Take 1 tablet (40 mg total) by mouth daily., Disp: 90 tablet, Rfl: 0  Allergies  Allergen Reactions  . Effexor Xr [Venlafaxine Hcl Er] Nausea And Vomiting and Other (See Comments)    "felt weird"  . Wilder Glade [Dapagliflozin] Other (See Comments)    headache  . Invokana [Canagliflozin] Other (See Comments)    vaginitis  . Kombiglyze [Saxagliptin-Metformin Er] Diarrhea and Other (See Comments)    dizziness  . Amoxicillin Rash     ROS  Constitutional: Negative for fever or weight change.  Respiratory: Negative for cough and shortness of  breath.   Cardiovascular: Negative for chest pain or palpitations.  Gastrointestinal: Negative for abdominal pain, no bowel changes.  Musculoskeletal: Negative for gait problem or joint swelling.  Skin: Negative for rash.  Neurological: Negative for dizziness or headache.  No other specific complaints in a complete review of systems (except as listed in HPI above).  Objective  Filed Vitals:   03/05/15 0850  BP: 110/64  Pulse: 80  Temp: 99 F (37.2 C)  TempSrc: Oral  Resp: 14  Height: 5\' 4"  (1.626 m)  Weight: 162 lb 8 oz (73.71 kg)  SpO2: 97%    Body mass index is 27.88 kg/(m^2).  Physical Exam Constitutional: Patient appears well-developed and  well-nourished. No distress.  HENT: Head: Normocephalic and atraumatic. Ears: B TMs ok, no erythema or effusion; Nose: Nose normal. Mouth/Throat: Oropharynx is clear and moist. No oropharyngeal exudate.  Eyes: Conjunctivae and EOM are normal. Pupils are equal, round, and reactive to light. No scleral icterus.  Neck: Normal range of motion. Neck supple. No JVD present. No thyromegaly present.  Cardiovascular: Normal rate, regular rhythm and normal heart sounds.  No murmur heard. No BLE edema. Pulmonary/Chest: Effort normal and breath sounds normal. No respiratory distress. Abdominal: Soft. Bowel sounds are normal, no distension. There is no tenderness. no masses Breast: no lumps or masses, no nipple discharge or rashes FEMALE GENITALIA:  External genitalia normal External urethra normal Bimanual exam normal without masses RECTAL: not done Musculoskeletal: Normal range of motion, no joint effusions. No gross deformities Neurological: he is alert and oriented to person, place, and time. No cranial nerve deficit. Coordination, balance, strength, speech and gait are normal.  Skin: Skin is warm and dry. No rash noted. No erythema.  Psychiatric: Patient has a normal mood and affect. behavior is normal. Judgment and thought content normal.   Recent Results (from the past 2160 hour(s))  Glucose, capillary     Status: Abnormal   Collection Time: 03/03/15  9:39 AM  Result Value Ref Range   Glucose-Capillary 190 (H) 65 - 99 mg/dL  Surgical pathology     Status: None   Collection Time: 03/03/15 10:14 AM  Result Value Ref Range   SURGICAL PATHOLOGY      Surgical Pathology CASE: [id number] PATIENT: Brandi Jordan Surgical Pathology Report     SPECIMEN SUBMITTED: A. Stomach, antrum, cbx B. Colon polyp, ascending, cbx  CLINICAL HISTORY: None provided  PRE-OPERATIVE DIAGNOSIS: Chronic nausea, Hematochezia  POST-OPERATIVE DIAGNOSIS: hiatal hernia, Gastritis, colon  polyp     DIAGNOSIS: A. STOMACH, ANTRUM; COLD BIOPSY: - ANTRAL MUCOSA WITH MINIMAL CHRONIC GASTRITIS AND MILD FOVEOLAR HYPERPLASIA. - NEGATIVE FOR H. PYLORI, DYSPLASIA AND MALIGNANCY.  B. COLON POLYP, ASCENDING; COLD BIOPSY: - TUBULAR ADENOMA. - NEGATIVE FOR HIGH-GRADE DYSPLASIA AND MALIGNANCY.     GROSS DESCRIPTION:  A. Labeled: C biopsy antrum Tissue Fragment(s): 2 Measurement: 0.4-0.5 cm Comment: Tan  Entirely submitted in cassette(s): 1  B. Labeled: C biopsy polyp AC Tissue Fragment(s): 1 Measurement: 0.15 cm Comment: Tan  Entirely submitted in cassette(s): 1     Final Diagnosis performed by Delorse Lek, MD.  Electronical ly signed 03/04/2015 1:33:12PM    The electronic signature indicates that the named Attending Pathologist has evaluated the specimen  Technical component performed at [facility], [address] [place], [place] [address] Lab: [phone] Dir: Darrick Penna. Evette Doffing, MD  Professional component performed at [facility], [facility], [place], [place],  [address] Lab: [phone] Dir: Dellia Nims. Reuel Derby, MD  PHQ2/9: Depression screen PHQ 2/9 03/05/2015  Decreased Interest 0  Down, Depressed, Hopeless 0  PHQ - 2 Score 0     Fall Risk: Fall Risk  03/05/2015  Falls in the past year? No    Assessment & Plan  1. Well woman exam Discussed importance of 150 minutes of physical activity weekly, eat two servings of fish weekly, eat one serving of tree nuts ( cashews, pistachios, pecans, almonds.Marland Kitchen) every other day, eat 6 servings of fruit/vegetables daily and drink plenty of water and avoid sweet beverages.   She will check coverage for shingles with her insurance  2. Breast cancer screening  - MM Digital Screening; Future  3. Gastritis  - pantoprazole (PROTONIX) 40 MG tablet; Take 1 tablet (40 mg total) by mouth daily.  Dispense: 90 tablet; Refill: 0  4. Colon cancer screening Up to date  58. Cervical  cancer screening No need , s/p hysterectomy

## 2015-03-06 ENCOUNTER — Telehealth: Payer: Self-pay

## 2015-03-06 NOTE — Telephone Encounter (Signed)
Pt notified. Added to recall list.

## 2015-03-06 NOTE — Telephone Encounter (Signed)
-----   Message from Lucilla Lame, MD sent at 03/05/2015 12:43 PM EDT ----- That the patient know that the stomach did not show any infection and only minimal inflammation. The colon showed a precancerous polyp in the patient will need a repeat colonoscopy in 5 years. No source for the patient's nausea was seen.

## 2015-03-12 ENCOUNTER — Ambulatory Visit (INDEPENDENT_AMBULATORY_CARE_PROVIDER_SITE_OTHER): Payer: 59 | Admitting: Family Medicine

## 2015-03-12 ENCOUNTER — Encounter: Payer: Self-pay | Admitting: Family Medicine

## 2015-03-12 VITALS — BP 126/72 | HR 90 | Temp 98.6°F | Resp 18 | Ht 64.0 in | Wt 163.4 lb

## 2015-03-12 DIAGNOSIS — E785 Hyperlipidemia, unspecified: Secondary | ICD-10-CM

## 2015-03-12 DIAGNOSIS — E538 Deficiency of other specified B group vitamins: Secondary | ICD-10-CM

## 2015-03-12 DIAGNOSIS — F411 Generalized anxiety disorder: Secondary | ICD-10-CM

## 2015-03-12 DIAGNOSIS — K2970 Gastritis, unspecified, without bleeding: Secondary | ICD-10-CM | POA: Diagnosis not present

## 2015-03-12 DIAGNOSIS — E1340 Other specified diabetes mellitus with diabetic neuropathy, unspecified: Secondary | ICD-10-CM

## 2015-03-12 DIAGNOSIS — E1342 Other specified diabetes mellitus with diabetic polyneuropathy: Secondary | ICD-10-CM

## 2015-03-12 LAB — POCT GLYCOSYLATED HEMOGLOBIN (HGB A1C): HEMOGLOBIN A1C: 6.3

## 2015-03-12 MED ORDER — ALBIGLUTIDE 30 MG ~~LOC~~ PEN: 30.0000 mg | PEN_INJECTOR | SUBCUTANEOUS | Status: DC

## 2015-03-12 MED ORDER — METFORMIN HCL ER (OSM) 1000 MG PO TB24
1000.0000 mg | ORAL_TABLET | Freq: Every evening | ORAL | Status: DC
Start: 2015-03-12 — End: 2015-06-30

## 2015-03-12 MED ORDER — ALPRAZOLAM 0.5 MG PO TABS: 0.5000 mg | ORAL_TABLET | Freq: Every day | ORAL | Status: DC | PRN

## 2015-03-12 NOTE — Progress Notes (Signed)
Name: Brandi Jordan   MRN: [id number]    DOB: Apr 24, 1961   Date:03/12/2015       Progress Note  Subjective  Chief Complaint  Chief Complaint  Patient presents with  . Medication Refill    3 month F/U  . Diabetes    checks BS once daily, Low-99 Average-145 High-191  . Anxiety    unchanged    HPI  DMII: she has been taking Metformin 1000 ER, the pills is very big and hard to swallow but she has been able to tolerate it.  FSBS has been around 145 and hgbA1C today is at goal, it was 7.7 when last checked . Over the past week glucose is going up to 180's fasting. HgbA1C may have improved because she was nauseated because of gastritis and is now eating again.  Anxiety: she takes Alprazolam qhs to help control her anxiety, and help her sleep, she states it seems to control her symptoms.  Gastritis: she was taking Protonix daily, but is waiting for mail order to deliver, currently on otc Prilosec, but has noticed some upper abdominal cramping, mild, nausea has decreased also.   Patient Active Problem List   Diagnosis Date Noted  . Internal hemorrhoids without complication 17/49/4496  . Secondary diabetes with peripheral neuropathy 03/05/2015  . Anxiety, generalized 03/05/2015  . B12 deficiency 03/05/2015  . Hyperlipidemia 03/05/2015  . Gastritis   . Benign neoplasm of ascending colon     Past Surgical History  Procedure Laterality Date  . Colonoscopy  09/01/2010    Iftikhar-diverticulosis only  . Partial hysterectomy  1990    Partial  . Abdominal hysterectomy    . Colonoscopy with propofol N/A 03/03/2015    Procedure: COLONOSCOPY WITH PROPOFOL;  Surgeon: Lucilla Lame, MD;  Location: [facility] ENDOSCOPY;  Service: Endoscopy;  Laterality: N/A;  . Esophagogastroduodenoscopy (egd) with propofol N/A 03/03/2015    Procedure: ESOPHAGOGASTRODUODENOSCOPY (EGD) WITH PROPOFOL;  Surgeon: Lucilla Lame, MD;  Location: [facility] ENDOSCOPY;  Service: Endoscopy;  Laterality: N/A;    Family History  Problem  Relation Age of Onset  . Cholecystitis Mother   . Kidney failure Mother   . Hypothyroidism Mother   . Cancer Father     lung  . Leukemia Sister     chronic lymphocytic  . Colon cancer Maternal Aunt 47  . Liver disease Neg Hx   . Hypothyroidism Daughter   . Cancer Maternal Aunt     Breast    History   Social History  . Marital Status: Married    Spouse Name: N/A  . Number of Children: 1  . Years of Education: N/A   Occupational History  . 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 Smoking status: Former Smoker -- 35 years    Types: Cigarettes    Quit date: 06/03/2010  . Smokeless tobacco: Not on file  . Alcohol Use: No  . Drug Use: No  . Sexual Activity: Not Currently   Other Topics Concern  . 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  ALPRAZolam (XANAX) 0.5 MG tablet, Take 1 tablet (0.5 mg total) by mouth daily as needed., Disp: 90 tablet, Rfl: 0 .  aspirin 81 MG tablet, Take 81 mg by mouth daily., Disp: , Rfl:  .  metformin (FORTAMET) 1000 MG (OSM) 24 hr tablet, Take 1 tablet (1,000  mg total) by mouth every evening., Disp: 90 tablet, Rfl: 1 .  pantoprazole (PROTONIX) 40 MG tablet, Take 1 tablet (40 mg total) by mouth daily., Disp: 90 tablet, Rfl: 0  Allergies  Allergen Reactions  . Effexor Xr [Venlafaxine Hcl Er] Nausea And Vomiting and Other (See Comments)    "felt weird"  . Wilder Glade [Dapagliflozin] Other (See Comments)    headache  . Invokana [Canagliflozin] Other (See Comments)    vaginitis  . Kombiglyze [Saxagliptin-Metformin Er] Diarrhea and Other (See Comments)    dizziness  . Amoxicillin Rash     ROS  Constitutional: Negative for fever or weight change.  Respiratory: Negative for cough and shortness of breath.   Cardiovascular: Negative for chest pain or palpitations.  Gastrointestinal: Negative for  abdominal pain, no bowel changes.  Musculoskeletal: Negative for gait problem or joint swelling.  Skin: Negative for rash.  Neurological: Negative for dizziness or headache.  No other specific complaints in a complete review of systems (except as listed in HPI above).  Objective  Filed Vitals:   03/12/15 0929  BP: 126/72  Pulse: 90  Temp: 98.6 F (37 C)  TempSrc: Oral  Resp: 18  Height: 5\' 4"  (1.626 m)  Weight: 163 lb 6.4 oz (74.118 kg)  SpO2: 98%    Body mass index is 28.03 kg/(m^2).  Physical Exam  Constitutional: Patient appears well-developed and well-nourished. No distress.  Eyes:  No scleral icterus. PERL Neck: Normal range of motion. Neck supple. No thyromegaly Cardiovascular: Normal rate, regular rhythm and normal heart sounds.  No murmur heard. No BLE edema. Pulmonary/Chest: Effort normal and breath sounds normal. No respiratory distress. Abdominal: Soft.  There is no tenderness. Psychiatric: Patient has a normal mood and affect. behavior is normal. Judgment and thought content normal.  Recent Results (from the past 2160 hour(s))  Glucose, capillary     Status: Abnormal   Collection Time: 03/03/15  9:39 AM  Result Value Ref Range   Glucose-Capillary 190 (H) 65 - 99 mg/dL  Surgical pathology     Status: None   Collection Time: 03/03/15 10:14 AM  Result Value Ref Range   SURGICAL PATHOLOGY      Surgical Pathology CASE: [id number] PATIENT: Taylyn Bouley Surgical Pathology Report     SPECIMEN SUBMITTED: A. Stomach, antrum, cbx B. Colon polyp, ascending, cbx  CLINICAL HISTORY: None provided  PRE-OPERATIVE DIAGNOSIS: Chronic nausea, Hematochezia  POST-OPERATIVE DIAGNOSIS: hiatal hernia, Gastritis, colon polyp     DIAGNOSIS: A. STOMACH, ANTRUM; COLD BIOPSY: - ANTRAL MUCOSA WITH MINIMAL CHRONIC GASTRITIS AND MILD FOVEOLAR HYPERPLASIA. - NEGATIVE FOR H. PYLORI, DYSPLASIA AND MALIGNANCY.  B. COLON POLYP, ASCENDING; COLD BIOPSY: - TUBULAR  ADENOMA. - NEGATIVE FOR HIGH-GRADE DYSPLASIA AND MALIGNANCY.     GROSS DESCRIPTION:  A. Labeled: C biopsy antrum Tissue Fragment(s): 2 Measurement: 0.4-0.5 cm Comment: Tan  Entirely submitted in cassette(s): 1  B. Labeled: C biopsy polyp AC Tissue Fragment(s): 1 Measurement: 0.15 cm Comment: Tan  Entirely submitted in cassette(s): 1     Final Diagnosis performed by Delorse Lek, MD.  Electronical ly signed 03/04/2015 1:33:12PM    The electronic signature indicates that the named Attending Pathologist has evaluated the specimen  Technical component performed at [place], [address], [place], [place] [address] Lab: [phone] Dir: Darrick Penna. Evette Doffing, MD  Professional component performed at [facility], [facility], [place], [place],  [address] Lab: [phone] Dir: Dellia Nims. Rubinas, MD    POCT HgB A1C     Status: None  Collection Time: 03/12/15  9:35 AM  Result Value Ref Range   Hemoglobin A1C 6.3      PHQ2/9: Depression screen PHQ 2/9 03/05/2015  Decreased Interest 0  Down, Depressed, Hopeless 0  PHQ - 2 Score 0     Fall Risk: Fall Risk  03/05/2015  Falls in the past year? No      Assessment & Plan  1. Secondary diabetes with peripheral neuropathy hgbA1C is at goal, but was not eating, now glucose is climbing up again, advised to try sample of Tanzeium - POCT HgB A1C - metformin (FORTAMET) 1000 MG (OSM) 24 hr tablet; Take 1 tablet (1,000 mg total) by mouth every evening.  Dispense: 90 tablet; Refill: 1 - Albiglutide (TANZEUM) 30 MG PEN; Inject 30 mg into the skin once a week.  Dispense: 4 each; Refill: 0  2. Anxiety, generalized Stable with medication - ALPRAZolam (XANAX) 0.5 MG tablet; Take 1 tablet (0.5 mg total) by mouth daily as needed.  Dispense: 90 tablet; Refill: 0  3. Gastritis Resume Protonix and eat bland diet  4. B12 deficiency Continue monthly injetions  5. Hyperlipidemia Not fasting on her labs,  she is working on dietary modification , recheck next visit

## 2015-03-12 NOTE — Patient Instructions (Signed)
Lipid panel shows low HDL : to improve HDL patient  needs to eat tree nuts ( pecans/pistachios/almonds ) four times weekly, eat fish two times weekly  and exercise  at least 150 minutes per week

## 2015-03-13 LAB — HM DIABETES EYE EXAM

## 2015-03-18 ENCOUNTER — Other Ambulatory Visit: Payer: Self-pay

## 2015-03-18 MED ORDER — FLUCONAZOLE 150 MG PO TABS: 150.0000 mg | ORAL_TABLET | Freq: Once | ORAL | Status: DC

## 2015-03-18 NOTE — Telephone Encounter (Signed)
Pt states is having a lot of irritation and itching want to get refill on diflucan?

## 2015-03-19 ENCOUNTER — Ambulatory Visit (INDEPENDENT_AMBULATORY_CARE_PROVIDER_SITE_OTHER): Payer: 59

## 2015-03-19 DIAGNOSIS — E538 Deficiency of other specified B group vitamins: Secondary | ICD-10-CM

## 2015-03-19 MED ORDER — CYANOCOBALAMIN 1000 MCG/ML IJ SOLN
1000.0000 ug | Freq: Once | INTRAMUSCULAR | Status: AC
  Administered 2015-03-19: 1000 ug via INTRAMUSCULAR

## 2015-04-06 ENCOUNTER — Telehealth: Payer: Self-pay | Admitting: Family Medicine

## 2015-04-06 MED ORDER — FLUCONAZOLE 150 MG PO TABS: 150.0000 mg | ORAL_TABLET | ORAL | Status: DC

## 2015-04-06 NOTE — Telephone Encounter (Signed)
Sent Diflucan for vaginal irritation, diarrhea unlikely to be from Tanzeum. She needs to stay hydrated

## 2015-04-06 NOTE — Telephone Encounter (Signed)
Patient states is having a consistent raw and itching feeling on the outside of her vaginal area since Wednesday or Thursday. Patient is Hydrocortisone 10 cream with some help.  She also just started Tanzeum yesterday and now having diarrhea.

## 2015-04-06 NOTE — Telephone Encounter (Signed)
Pt would like a call back

## 2015-04-07 NOTE — Telephone Encounter (Signed)
Pt.notified

## 2015-04-23 ENCOUNTER — Other Ambulatory Visit: Payer: Self-pay | Admitting: Family Medicine

## 2015-04-27 ENCOUNTER — Ambulatory Visit (INDEPENDENT_AMBULATORY_CARE_PROVIDER_SITE_OTHER): Payer: 59

## 2015-04-27 DIAGNOSIS — E538 Deficiency of other specified B group vitamins: Secondary | ICD-10-CM | POA: Diagnosis not present

## 2015-04-27 MED ORDER — CYANOCOBALAMIN 1000 MCG/ML IJ SOLN
1000.0000 ug | Freq: Once | INTRAMUSCULAR | Status: AC
  Administered 2015-04-27: 1000 ug via INTRAMUSCULAR

## 2015-05-11 ENCOUNTER — Encounter: Payer: Self-pay | Admitting: Family Medicine

## 2015-05-11 DIAGNOSIS — M6530 Trigger finger, unspecified finger: Secondary | ICD-10-CM | POA: Insufficient documentation

## 2015-05-12 ENCOUNTER — Telehealth: Payer: Self-pay | Admitting: Gastroenterology

## 2015-05-12 NOTE — Telephone Encounter (Signed)
Patient called and said her stomach is still burning and taking the protonics still. Is there anything else she can do? Please advise.

## 2015-05-13 ENCOUNTER — Encounter: Payer: Self-pay | Admitting: Family Medicine

## 2015-05-20 ENCOUNTER — Ambulatory Visit (INDEPENDENT_AMBULATORY_CARE_PROVIDER_SITE_OTHER): Payer: 59 | Admitting: Family Medicine

## 2015-05-20 ENCOUNTER — Encounter: Payer: Self-pay | Admitting: Family Medicine

## 2015-05-20 VITALS — BP 124/80 | HR 91 | Temp 98.7°F | Resp 16 | Ht 64.0 in | Wt 162.6 lb

## 2015-05-20 DIAGNOSIS — Z79899 Other long term (current) drug therapy: Secondary | ICD-10-CM | POA: Diagnosis not present

## 2015-05-20 DIAGNOSIS — Z23 Encounter for immunization: Secondary | ICD-10-CM

## 2015-05-20 DIAGNOSIS — E1340 Other specified diabetes mellitus with diabetic neuropathy, unspecified: Secondary | ICD-10-CM | POA: Diagnosis not present

## 2015-05-20 DIAGNOSIS — N760 Acute vaginitis: Secondary | ICD-10-CM | POA: Diagnosis not present

## 2015-05-20 DIAGNOSIS — K449 Diaphragmatic hernia without obstruction or gangrene: Secondary | ICD-10-CM | POA: Diagnosis not present

## 2015-05-20 DIAGNOSIS — Z1159 Encounter for screening for other viral diseases: Secondary | ICD-10-CM | POA: Diagnosis not present

## 2015-05-20 DIAGNOSIS — E785 Hyperlipidemia, unspecified: Secondary | ICD-10-CM

## 2015-05-20 DIAGNOSIS — F4321 Adjustment disorder with depressed mood: Secondary | ICD-10-CM | POA: Diagnosis not present

## 2015-05-20 DIAGNOSIS — F4329 Adjustment disorder with other symptoms: Secondary | ICD-10-CM

## 2015-05-20 DIAGNOSIS — E1342 Other specified diabetes mellitus with diabetic polyneuropathy: Secondary | ICD-10-CM

## 2015-05-20 DIAGNOSIS — K2970 Gastritis, unspecified, without bleeding: Secondary | ICD-10-CM | POA: Diagnosis not present

## 2015-05-20 DIAGNOSIS — E538 Deficiency of other specified B group vitamins: Secondary | ICD-10-CM

## 2015-05-20 DIAGNOSIS — I700 Atherosclerosis of aorta: Secondary | ICD-10-CM

## 2015-05-20 DIAGNOSIS — K760 Fatty (change of) liver, not elsewhere classified: Secondary | ICD-10-CM | POA: Diagnosis not present

## 2015-05-20 DIAGNOSIS — Z634 Disappearance and death of family member: Secondary | ICD-10-CM

## 2015-05-20 MED ORDER — FLUCONAZOLE 150 MG PO TABS: 150.0000 mg | ORAL_TABLET | ORAL | Status: DC

## 2015-05-20 MED ORDER — OMEPRAZOLE-SODIUM BICARBONATE 40-1100 MG PO CAPS: 1.0000 | ORAL_CAPSULE | Freq: Every day | ORAL | Status: DC

## 2015-05-20 MED ORDER — HYOSCYAMINE SULFATE ER 0.375 MG PO TB12: 0.3750 mg | ORAL_TABLET | Freq: Two times a day (BID) | ORAL | Status: DC | PRN

## 2015-05-20 MED ORDER — ONDANSETRON HCL 4 MG PO TABS: 4.0000 mg | ORAL_TABLET | Freq: Three times a day (TID) | ORAL | Status: DC | PRN

## 2015-05-20 NOTE — Progress Notes (Signed)
Name: Brandi Jordan   MRN: [id number]    DOB: Sep 22, 1960   Date:05/20/2015       Progress Note  Subjective  Chief Complaint  Chief Complaint  Patient presents with  . Abdominal Pain    epi gastric pain with nausea and some vomiting, pt described as cramping feeling.  Comes and goes and is tired wants answers  . Vaginal Itching    ongoing off and on burning sensation    HPI  Gastritis/Hiatal hernia: she has been having symptoms of epigastric pain and nausea with bloating going on for about 9 months. She has had multiple test. Including [place] , CT scan and EGD/colonoscopy.  So far it has shown: fatty liver, gastritis and hiatal hernia. She was given Protonix by Dr. Durwin Reges a couple of months ago but symptoms have not improved, except for Levsin that helps with nausea.  Nausea has been daily and pain is cramping like and is on upper abdomen, at times pain is burning like. She is tired of feeling sick.   Recurrent Vaginitis: states the only way symptoms are controlled is when she takes Diflucan weekly. No vaginal discharge but has burning and itching sensation in her vagina.  Grieving: lost her mother in March 1950 - she had complications of renal failure and CVA. Her mother was her best friend, she is still grieving, crying multiple times weekly, still has a void inside of her. She is back to normal ADL and work. She is now getting ready to sale her house - the house that she grew up in.   DMII: taking medication as prescribed. Glucose is in the 180's fasting. She is not very compliant with her diet.   Dyslipidemia: low HDL, and high triglycerides. She has calcified abdominal aorta on x-ray , but does not want to take statin at this time   Patient Active Problem List   Diagnosis Date Noted  . Fatty liver 05/20/2015  . Calcification of aorta 05/20/2015  . Recurrent vaginitis 05/20/2015  . Hiatal hernia 05/20/2015  . Trigger finger 05/11/2015  . Internal hemorrhoids without complication  [id number]/[id number]  . Secondary diabetes with peripheral neuropathy 03/05/2015  . Anxiety, generalized 03/05/2015  . B12 deficiency 03/05/2015  . Hyperlipidemia 03/05/2015  . Gastritis   . Benign neoplasm of ascending colon     Past Surgical History  Procedure Laterality Date  . Colonoscopy  09/01/2010    Iftikhar-diverticulosis only  . Partial hysterectomy  1990    Partial  . Abdominal hysterectomy    . Colonoscopy with propofol N/A 03/03/2015    Procedure: COLONOSCOPY WITH PROPOFOL;  Surgeon: Lucilla Lame, MD;  Location: [facility] ENDOSCOPY;  Service: Endoscopy;  Laterality: N/A;  . Esophagogastroduodenoscopy (egd) with propofol N/A 03/03/2015    Procedure: ESOPHAGOGASTRODUODENOSCOPY (EGD) WITH PROPOFOL;  Surgeon: Lucilla Lame, MD;  Location: [facility] ENDOSCOPY;  Service: Endoscopy;  Laterality: N/A;    Family History  Problem Relation Age of Onset  . Cholecystitis Mother   . Kidney failure Mother   . Hypothyroidism Mother   . Cancer Father     lung  . Leukemia Sister     chronic lymphocytic  . Colon cancer Maternal Aunt 26  . Liver disease Neg Hx   . Hypothyroidism Daughter   . Cancer Maternal Aunt     Breast    Social History   Social History  . Marital Status: Married    Spouse Name: N/A  . Number of Children: 1  . Years of Education: N/A  Occupational History  . customer service [employer] History Main Topics  . Smoking status: Former Smoker -- 35 years    Types: Cigarettes    Quit date: 06/03/2010  . Smokeless tobacco: Not on file  . Alcohol Use: No  . Drug Use: No  . Sexual Activity: Not Currently   Other Topics Concern  . Not on file   Social History Narrative   Lives with husband, 1 healthy grown daughter, works in Therapist, art at [employer] outpatient prescriptions:  .  Albiglutide (TANZEUM) 30 MG PEN, Inject 30 mg into the skin once a week., Disp: 4 each, Rfl: 0 .  ALPRAZolam (XANAX) 0.5 MG tablet, Take 1 tablet (0.5 mg total) by mouth  daily as needed., Disp: 90 tablet, Rfl: 0 .  aspirin 81 MG tablet, Take 81 mg by mouth daily., Disp: , Rfl:  .  fluconazole (DIFLUCAN) 150 MG tablet, Take 1 tablet (150 mg total) by mouth once a week., Disp: 12 tablet, Rfl: 0 .  hyoscyamine (LEVBID) 0.375 MG 12 hr tablet, Take 1 tablet (0.375 mg total) by mouth every 12 (twelve) hours as needed., Disp: 180 tablet, Rfl: 0 .  metformin (FORTAMET) 1000 MG (OSM) 24 hr tablet, Take 1 tablet (1,000 mg total) by mouth every evening., Disp: 90 tablet, Rfl: 1 .  omeprazole-sodium bicarbonate (ZEGERID) 40-1100 MG per capsule, Take 1 capsule by mouth daily before breakfast., Disp: 30 capsule, Rfl: 0 .  ondansetron (ZOFRAN) 4 MG tablet, Take 1 tablet (4 mg total) by mouth every 8 (eight) hours as needed for nausea or vomiting., Disp: 20 tablet, Rfl: 0  Allergies  Allergen Reactions  . Effexor Xr [Venlafaxine Hcl Er] Nausea And Vomiting and Other (See Comments)    "felt weird"  . Wilder Glade [Dapagliflozin] Other (See Comments)    headache  . Invokana [Canagliflozin] Other (See Comments)    vaginitis  . Kombiglyze [Saxagliptin-Metformin Er] Diarrhea and Other (See Comments)    dizziness  . Amoxicillin Rash     ROS  Constitutional: Negative for fever or weight change.  Respiratory: Negative for cough and shortness of breath.   Cardiovascular: Negative for chest pain or palpitations.  Gastrointestinal: Negative for abdominal pain, no bowel changes.  Musculoskeletal: Negative for gait problem or joint swelling.  Skin: Negative for rash.  Neurological: Negative for dizziness or headache.  No other specific complaints in a complete review of systems (except as listed in HPI above).  Objective  Filed Vitals:   05/20/15 1142  BP: 124/80  Pulse: 91  Temp: 98.7 F (37.1 C)  TempSrc: Oral  Resp: 16  Height: 5\' 4"  (1.626 m)  Weight: 162 lb 9.6 oz (73.755 kg)  SpO2: 97%    Body mass index is 27.9 kg/(m^2).  Physical Exam  Constitutional:  Patient appears well-developed and well-nourished. Obese No distress.  HEENT: head atraumatic, normocephalic, pupils equal and reactive to light,  neck supple, throat within normal limits Cardiovascular: Normal rate, regular rhythm and normal heart sounds.  No murmur heard. No BLE edema. Pulmonary/Chest: Effort normal and breath sounds normal. No respiratory distress. Abdominal: Soft.  There is no tenderness. Psychiatric: Patient has a normal mood and affect. behavior is normal. Judgment and thought content normal.  Recent Results (from the past 2160 hour(s))  Glucose, capillary     Status: Abnormal   Collection Time: 03/03/15  9:39 AM  Result Value Ref Range   Glucose-Capillary 190 (H) 65 - 99 mg/dL  Surgical  pathology     Status: None   Collection Time: 03/03/15 10:14 AM  Result Value Ref Range   SURGICAL PATHOLOGY      Surgical Pathology CASE: [id number] PATIENT: Len Blitzer Surgical Pathology Report     SPECIMEN SUBMITTED: A. Stomach, antrum, cbx B. Colon polyp, ascending, cbx  CLINICAL HISTORY: None provided  PRE-OPERATIVE DIAGNOSIS: Chronic nausea, Hematochezia  POST-OPERATIVE DIAGNOSIS: hiatal hernia, Gastritis, colon polyp     DIAGNOSIS: A. STOMACH, ANTRUM; COLD BIOPSY: - ANTRAL MUCOSA WITH MINIMAL CHRONIC GASTRITIS AND MILD FOVEOLAR HYPERPLASIA. - NEGATIVE FOR H. PYLORI, DYSPLASIA AND MALIGNANCY.  B. COLON POLYP, ASCENDING; COLD BIOPSY: - TUBULAR ADENOMA. - NEGATIVE FOR HIGH-GRADE DYSPLASIA AND MALIGNANCY.     GROSS DESCRIPTION:  A. Labeled: C biopsy antrum Tissue Fragment(s): 2 Measurement: 0.4-0.5 cm Comment: Tan  Entirely submitted in cassette(s): 1  B. Labeled: C biopsy polyp AC Tissue Fragment(s): 1 Measurement: 0.15 cm Comment: Tan  Entirely submitted in cassette(s): 1     Final Diagnosis performed by Delorse Lek, MD.  Electronical ly signed 03/04/2015 1:33:12PM    The electronic signature indicates that the named Attending  Pathologist has evaluated the specimen  Technical component performed at [facility], [address] [place], [place] [address] Lab: [phone] Dir: Darrick Penna. Evette Doffing, MD  Professional component performed at [facility], [facility], [place], [place], [place] [address] Lab: [phone] Dir: Dellia Nims. Rubinas, MD    POCT HgB A1C     Status: None   Collection Time: 03/12/15  9:35 AM  Result Value Ref Range   Hemoglobin A1C 6.3    PHQ2/9: Depression screen [facility] 2/9 05/20/2015 03/05/2015  Decreased Interest 0 0  Down, Depressed, Hopeless 0 0  PHQ - 2 Score 0 0    Fall Risk: Fall Risk  05/20/2015 03/05/2015  Falls in the past year? No No      Functional Status Survey: Is the patient deaf or have difficulty hearing?: No Does the patient have difficulty seeing, even when wearing glasses/contacts?: Yes (glasses) Does the patient have difficulty concentrating, remembering, or making decisions?: No Does the patient have difficulty walking or climbing stairs?: No Does the patient have difficulty dressing or bathing?: No Does the patient have difficulty doing errands alone such as visiting a doctor's office or shopping?: No    Assessment & Plan  1. Fatty liver  She has a follow up with Dr. Durwin Reges in one month, and discussed with patient that she may need to have a liver biopsy.   2. Needs flu shot  - Flu Vaccine QUAD 36+ mos PF IM (Fluarix & Fluzone Quad PF)  3. Calcification of aorta  She does not want to start statin at this time  4. B12 deficiency  She is getting monthly B12  5. Secondary diabetes with peripheral neuropathy  Discussed importance of following a diabetic diet, recheck labs prior to next visit so we can adjust medication accordingly.  - Hemoglobin A1c  6. Hyperlipidemia  - Lipid panel  7. Recurrent vaginitis  Diflucan weekly  8. Complicated grieving  She still misses her mother, tearful today, does not want medication at this  time  48. Gastritis  Change from Protonix to Zegerid since insurance does not pay for Dexilant  10. Hiatal hernia   11. Long-term use of high-risk medication  - Comprehensive metabolic panel  12. Need for hepatitis C screening test  - Hepatitis C antibody

## 2015-05-27 ENCOUNTER — Ambulatory Visit
Admission: RE | Admit: 2015-05-27 | Discharge: 2015-05-27 | Disposition: A | Discharge status: Home / Self Care | Payer: 59 | Source: Ambulatory Visit | Attending: Family Medicine | Admitting: Family Medicine

## 2015-05-27 DIAGNOSIS — Z1231 Encounter for screening mammogram for malignant neoplasm of breast: Secondary | ICD-10-CM | POA: Diagnosis present

## 2015-05-27 DIAGNOSIS — Z1239 Encounter for other screening for malignant neoplasm of breast: Secondary | ICD-10-CM

## 2015-05-28 ENCOUNTER — Encounter: Payer: Self-pay | Admitting: Family Medicine

## 2015-06-05 NOTE — Telephone Encounter (Signed)
Has appt 10/24.

## 2015-06-15 ENCOUNTER — Ambulatory Visit: Payer: 59 | Admitting: Family Medicine

## 2015-06-15 ENCOUNTER — Ambulatory Visit: Payer: 59 | Admitting: Gastroenterology

## 2015-06-15 ENCOUNTER — Encounter: Payer: Self-pay | Admitting: Family Medicine

## 2015-06-20 LAB — LIPID PANEL
CHOL/HDL RATIO: 7.2 ratio — AB (ref 0.0–4.4)
CHOLESTEROL TOTAL: 186 mg/dL (ref 100–199)
HDL: 26 mg/dL — ABNORMAL LOW (ref >39)
TRIGLYCERIDES: 403 mg/dL — AB (ref 0–149)

## 2015-06-20 LAB — COMPREHENSIVE METABOLIC PANEL
A/G RATIO: 2 (ref 1.1–2.5)
ALBUMIN: 4.7 g/dL (ref 3.5–5.5)
ALK PHOS: 137 IU/L — AB (ref 39–117)
ALT: 27 IU/L (ref 0–32)
AST: 19 IU/L (ref 0–40)
BILIRUBIN TOTAL: 0.4 mg/dL (ref 0.0–1.2)
BUN / CREAT RATIO: 19 (ref 9–23)
BUN: 15 mg/dL (ref 6–24)
CHLORIDE: 95 mmol/L — AB (ref 97–106)
CO2: 22 mmol/L (ref 18–29)
Calcium: 9.9 mg/dL (ref 8.7–10.2)
Creatinine, Ser: 0.78 mg/dL (ref 0.57–1.00)
GFR calc non Af Amer: 85 mL/min/{1.73_m2} (ref >59)
GFR, EST AFRICAN AMERICAN: 98 mL/min/{1.73_m2} (ref >59)
GLOBULIN, TOTAL: 2.4 g/dL (ref 1.5–4.5)
Glucose: 200 mg/dL — ABNORMAL HIGH (ref 65–99)
POTASSIUM: 4.4 mmol/L (ref 3.5–5.2)
SODIUM: 137 mmol/L (ref 136–144)
TOTAL PROTEIN: 7.1 g/dL (ref 6.0–8.5)

## 2015-06-20 LAB — HEMOGLOBIN A1C
Est. average glucose Bld gHb Est-mCnc: 197 mg/dL
Hgb A1c MFr Bld: 8.5 % — ABNORMAL HIGH (ref 4.8–5.6)

## 2015-06-20 LAB — HEPATITIS C ANTIBODY

## 2015-06-21 ENCOUNTER — Other Ambulatory Visit: Payer: Self-pay | Admitting: Family Medicine

## 2015-06-21 DIAGNOSIS — E1342 Other specified diabetes mellitus with diabetic polyneuropathy: Secondary | ICD-10-CM

## 2015-06-21 MED ORDER — ATORVASTATIN CALCIUM 40 MG PO TABS: 40.0000 mg | ORAL_TABLET | Freq: Every day | ORAL | Status: DC

## 2015-06-21 MED ORDER — ALBIGLUTIDE 50 MG ~~LOC~~ PEN: 50.0000 mg | PEN_INJECTOR | SUBCUTANEOUS | Status: DC

## 2015-06-22 ENCOUNTER — Ambulatory Visit (INDEPENDENT_AMBULATORY_CARE_PROVIDER_SITE_OTHER): Payer: 59 | Admitting: Gastroenterology

## 2015-06-22 ENCOUNTER — Other Ambulatory Visit: Payer: Self-pay

## 2015-06-22 ENCOUNTER — Encounter: Payer: Self-pay | Admitting: Gastroenterology

## 2015-06-22 VITALS — BP 136/74 | HR 89 | Temp 98.0°F | Ht 65.0 in | Wt 166.0 lb

## 2015-06-22 DIAGNOSIS — R112 Nausea with vomiting, unspecified: Secondary | ICD-10-CM | POA: Diagnosis not present

## 2015-06-22 DIAGNOSIS — K921 Melena: Secondary | ICD-10-CM | POA: Diagnosis not present

## 2015-06-22 MED ORDER — DICYCLOMINE HCL 10 MG PO CAPS: 10.0000 mg | ORAL_CAPSULE | Freq: Three times a day (TID) | ORAL | Status: DC

## 2015-06-22 NOTE — Progress Notes (Signed)
Primary Care Physician: Loistine Chance, MD  Primary Gastroenterologist:  Dr. Lucilla Lame  Chief Complaint  Patient presents with  . GASTRITIS    HPI: Brandi Jordan is a 54 y.o. female here patient comes in today with a report of nausea rectal bleeding and abdominal discomfort that she states started after having the colonoscopy. On reviewing the chart it appears that the patient's symptoms are why the colonoscopy and EGD was being done. When the patient was reminded of that she and had a recollection that that was what was going on. The patient reports that her abdominal pain is in the upper right and left abdomen and is a burning sensation. She has not had any unexplained weight loss. She continues to have some intermittent rectal bleeding and states she takes NSAIDs only as needed.   Current Outpatient Prescriptions  Medication Sig Dispense Refill  . ALPRAZolam (XANAX) 0.5 MG tablet Take 1 tablet (0.5 mg total) by mouth daily as needed. 90 tablet 0  . aspirin 81 MG tablet Take 81 mg by mouth daily.    . fluconazole (DIFLUCAN) 150 MG tablet Take 1 tablet (150 mg total) by mouth once a week. 12 tablet 0  . fluticasone (CUTIVATE) 0.05 % cream APPLY TO AFFECTED AREA TWICE A DAY  0  . hyoscyamine (LEVBID) 0.375 MG 12 hr tablet Take 1 tablet (0.375 mg total) by mouth every 12 (twelve) hours as needed. 180 tablet 0  . metformin (FORTAMET) 1000 MG (OSM) 24 hr tablet Take 1 tablet (1,000 mg total) by mouth every evening. 90 tablet 1  . omeprazole-sodium bicarbonate (ZEGERID) 40-1100 MG per capsule Take 1 capsule by mouth daily before breakfast. 30 capsule 0  . ondansetron (ZOFRAN) 4 MG tablet Take 1 tablet (4 mg total) by mouth every 8 (eight) hours as needed for nausea or vomiting. 20 tablet 0  . ONETOUCH VERIO test strip     . Albiglutide (TANZEUM) 50 MG PEN Inject 50 mg into the skin once a week. (Patient not taking: Reported on 06/22/2015) 4 each 5  . atorvastatin (LIPITOR) 40 MG tablet  Take 1 tablet (40 mg total) by mouth daily. (Patient not taking: Reported on 06/22/2015) 90 tablet 3  . dicyclomine (BENTYL) 10 MG capsule Take 1 capsule (10 mg total) by mouth 3 (three) times daily before meals. 90 capsule 1   No current facility-administered medications for this visit.    Allergies as of 06/22/2015 - Review Complete 06/22/2015  Allergen Reaction Noted  . Effexor xr [venlafaxine hcl er] Nausea And Vomiting and Other (See Comments) 02/02/2015  . Wilder Glade [dapagliflozin] Other (See Comments) 02/02/2015  . Invokana [canagliflozin] Other (See Comments) 02/02/2015  . Kombiglyze [saxagliptin-metformin er] Diarrhea and Other (See Comments) 02/02/2015  . Other Other (See Comments) and Nausea Only 06/22/2015  . Amoxicillin Rash 01/29/2015    ROS:  General: Negative for anorexia, weight loss, fever, chills, fatigue, weakness. ENT: Negative for hoarseness, difficulty swallowing , nasal congestion. CV: Negative for chest pain, angina, palpitations, dyspnea on exertion, peripheral edema.  Respiratory: Negative for dyspnea at rest, dyspnea on exertion, cough, sputum, wheezing.  GI: See history of present illness. GU:  Negative for dysuria, hematuria, urinary incontinence, urinary frequency, nocturnal urination.  Endo: Negative for unusual weight change.    Physical Examination:   BP 136/74 mmHg  Pulse 89  Temp(Src) 98 F (36.7 C) (Oral)  Ht 5\' 5"  (1.651 m)  Wt 166 lb (75.297 kg)  BMI 27.62 kg/m2  General: Well-nourished,  well-developed in no acute distress.  Eyes: No icterus. Conjunctivae pink. Mouth: Oropharyngeal mucosa moist and pink , no lesions erythema or exudate. Lungs: Clear to auscultation bilaterally. Non-labored. Heart: Regular rate and rhythm, no murmurs rubs or gallops.  Abdomen: Bowel sounds are normal, nontender, nondistended, no hepatosplenomegaly or masses, no abdominal bruits or hernia , no rebound or guarding.   Extremities: No lower extremity edema. No  clubbing or deformities. Neuro: Alert and oriented x 3.  Grossly intact. Skin: Warm and dry, no jaundice.   Psych: Alert and cooperative, normal mood and affect.  Labs:    Imaging Studies: Mm Digital Screening  05/28/2015  CLINICAL DATA:  Screening. EXAM: DIGITAL SCREENING BILATERAL MAMMOGRAM WITH CAD COMPARISON:  Previous exam(s). ACR Breast Density Category b: There are scattered areas of fibroglandular density. FINDINGS: There are no findings suspicious for malignancy. Images were processed with CAD. IMPRESSION: No mammographic evidence of malignancy. A result letter of this screening mammogram will be mailed directly to the patient. RECOMMENDATION: Screening mammogram in one year. (Code:SM-B-01Y) BI-RADS CATEGORY  1: Negative. Electronically Signed   By: Margarette Canada M.D.   On: 05/28/2015 09:23    Assessment and Plan:   Brandi Jordan is a 54 y.o. y/o female who had an EGD and colonoscopy with minimal gastritis and internal hemorrhoids. The patient states that she is now having abdominal pain in the upper abdomen. The patient reports that her symptoms have been worse since her mother has died. She also reports that she goes out to dinner with her husband and he is disabled thing he does but she then has abdominal pain and discomfort. The patient likely has some irritable bowel syndrome and will be started on dicyclomine 10 mg 3 times a day. She will also start taking her PPI in the evening since she appears to have most of her nausea in the morning.  Note: This dictation was prepared with Dragon dictation along with smaller phrase technology. Any transcriptional errors that result from this process are unintentional.

## 2015-06-29 ENCOUNTER — Ambulatory Visit: Payer: 59 | Admitting: Family Medicine

## 2015-06-30 ENCOUNTER — Ambulatory Visit (INDEPENDENT_AMBULATORY_CARE_PROVIDER_SITE_OTHER): Payer: 59 | Admitting: Family Medicine

## 2015-06-30 ENCOUNTER — Encounter: Payer: Self-pay | Admitting: Family Medicine

## 2015-06-30 ENCOUNTER — Other Ambulatory Visit: Payer: Self-pay | Admitting: Family Medicine

## 2015-06-30 VITALS — BP 110/68 | HR 97 | Temp 99.1°F | Resp 16 | Ht 66.0 in | Wt 166.8 lb

## 2015-06-30 DIAGNOSIS — E1342 Other specified diabetes mellitus with diabetic polyneuropathy: Secondary | ICD-10-CM

## 2015-06-30 DIAGNOSIS — K2970 Gastritis, unspecified, without bleeding: Secondary | ICD-10-CM

## 2015-06-30 DIAGNOSIS — E538 Deficiency of other specified B group vitamins: Secondary | ICD-10-CM | POA: Diagnosis not present

## 2015-06-30 DIAGNOSIS — E785 Hyperlipidemia, unspecified: Secondary | ICD-10-CM | POA: Diagnosis not present

## 2015-06-30 MED ORDER — METFORMIN HCL ER (OSM) 1000 MG PO TB24
2000.0000 mg | ORAL_TABLET | Freq: Every evening | ORAL | Status: DC
Start: 2015-06-30 — End: 2015-11-30

## 2015-06-30 MED ORDER — CYANOCOBALAMIN 1000 MCG/ML IJ SOLN
1000.0000 ug | Freq: Once | INTRAMUSCULAR | Status: AC
  Administered 2015-06-30: 1000 ug via INTRAMUSCULAR

## 2015-06-30 MED ORDER — INSULIN DETEMIR 100 UNIT/ML FLEXPEN
10.0000 [IU] | PEN_INJECTOR | Freq: Every day | SUBCUTANEOUS | Status: DC
Start: 2015-06-30 — End: 2015-11-30

## 2015-06-30 NOTE — Progress Notes (Signed)
Name: Brandi Jordan   MRN: [id number]    DOB: August 24, 1961   Date:06/30/2015       Progress Note  Subjective  Chief Complaint  Chief Complaint  Patient presents with  . Medication Refill    follow-up  . Diabetes    Checks BG 1 qd, low-86, high-302, Avg-220 abnormal labs you wanted her to start tanzeum will discuss today  . Hyperlipidemia    abnormal labs you wanted her to start chol med. will discuss today    HPI  DMII: hgbA1C has gone up, now at 8.5%, glucose at home averaging 220's.  She is taking Metformin daily, she has occasionally diarrhea, she took Tanzeum but stopped because of nausea ( later on diagnosed with gastritis by GI - but scared to take medication) used to be on Levemir in the past but was afraid of depending on insulin.  Hyperlipidemia: we gave her Atorvastatin, but it has not been delivered to her house yet, discussed possible side effects of medication  Gastritis: she has seen Dr. Durwin Reges, now on Protonix, has not filled Bentyl yet, she still has a lot of nausea, but not daily like it used to be. Feels discouraged about feeling sick all the time. Gave her reassurance . She will get better  Patient Active Problem List   Diagnosis Date Noted  . Fatty liver 05/20/2015  . Calcification of aorta (HCC) 05/20/2015  . Recurrent vaginitis 05/20/2015  . Hiatal hernia 05/20/2015  . Trigger finger 05/11/2015  . Internal hemorrhoids without complication 37/16/9678  . Secondary diabetes with peripheral neuropathy ([place]) 03/05/2015  . Anxiety, generalized 03/05/2015  . B12 deficiency 03/05/2015  . Hyperlipidemia 03/05/2015  . Gastritis   . Benign neoplasm of ascending colon     Past Surgical History  Procedure Laterality Date  . Colonoscopy  09/01/2010    Iftikhar-diverticulosis only  . Partial hysterectomy  1990    Partial  . Abdominal hysterectomy    . Colonoscopy with propofol N/A 03/03/2015    Procedure: COLONOSCOPY WITH PROPOFOL;  Surgeon: Lucilla Lame, MD;  Location:  [facility] ENDOSCOPY;  Service: Endoscopy;  Laterality: N/A;  . Esophagogastroduodenoscopy (egd) with propofol N/A 03/03/2015    Procedure: ESOPHAGOGASTRODUODENOSCOPY (EGD) WITH PROPOFOL;  Surgeon: Lucilla Lame, MD;  Location: [facility] ENDOSCOPY;  Service: Endoscopy;  Laterality: N/A;  . Breast biopsy Right     rt cyst removed    Family History  Problem Relation Age of Onset  . Cholecystitis Mother   . Kidney failure Mother   . Hypothyroidism Mother   . Cancer Father     lung  . Leukemia Sister     chronic lymphocytic  . Colon cancer Maternal Aunt 76  . Liver disease Neg Hx   . Hypothyroidism Daughter   . Cancer Maternal Aunt     Breast  . Breast cancer Maternal Aunt     Social History   Social History  . Marital Status: Married    Spouse Name: N/A  . Number of Children: 1  . Years of Education: N/A   Occupational History  . customer service [employer] History Main Topics  . Smoking status: Former Smoker -- 35 years    Types: Cigarettes    Quit date: 06/03/2010  . Smokeless tobacco: Never Used  . Alcohol Use: No  . Drug Use: No  . Sexual Activity: Not Currently   Other Topics Concern  . Not on file   Social History Narrative   Lives with husband, 1 healthy  grown daughter, works in Therapist, art at [place] outpatient prescriptions:  .  ALPRAZolam (XANAX) 0.5 MG tablet, Take 1 tablet (0.5 mg total) by mouth daily as needed., Disp: 90 tablet, Rfl: 0 .  aspirin 81 MG tablet, Take 81 mg by mouth daily., Disp: , Rfl:  .  atorvastatin (LIPITOR) 40 MG tablet, Take 1 tablet (40 mg total) by mouth daily. (Patient not taking: Reported on 06/22/2015), Disp: 90 tablet, Rfl: 3 .  dicyclomine (BENTYL) 10 MG capsule, Take 1 capsule (10 mg total) by mouth 3 (three) times daily before meals., Disp: 90 capsule, Rfl: 1 .  fluconazole (DIFLUCAN) 150 MG tablet, Take 1 tablet (150 mg total) by mouth once a week., Disp: 12 tablet, Rfl: 0 .  fluticasone (CUTIVATE) 0.05 %  cream, APPLY TO AFFECTED AREA TWICE A DAY, Disp: , Rfl: 0 .  hyoscyamine (LEVBID) 0.375 MG 12 hr tablet, Take 1 tablet (0.375 mg total) by mouth every 12 (twelve) hours as needed., Disp: 180 tablet, Rfl: 0 .  Insulin Detemir (LEVEMIR FLEXTOUCH) 100 UNIT/ML Pen, Inject 10 Units into the skin daily at 10 pm., Disp: 45 mL, Rfl: 1 .  metformin (FORTAMET) 1000 MG (OSM) 24 hr tablet, Take 2 tablets (2,000 mg total) by mouth every evening., Disp: 180 tablet, Rfl: 1 .  ondansetron (ZOFRAN) 4 MG tablet, Take 1 tablet (4 mg total) by mouth every 8 (eight) hours as needed for nausea or vomiting., Disp: 20 tablet, Rfl: 0 .  ONETOUCH VERIO test strip, , Disp: , Rfl:  .  pantoprazole (PROTONIX) 40 MG tablet, Take 1 tablet by mouth  daily, Disp: 90 tablet, Rfl: 0  Allergies  Allergen Reactions  . Bydureon [Exenatide] Nausea And Vomiting  . Effexor Xr [Venlafaxine Hcl Er] Nausea And Vomiting and Other (See Comments)    "felt weird"  . Wilder Glade [Dapagliflozin] Other (See Comments)    headache  . Invokana [Canagliflozin] Other (See Comments)    vaginitis  . Kombiglyze [Saxagliptin-Metformin Er] Diarrhea and Other (See Comments)    dizziness  . Other Other (See Comments) and Nausea Only    Diabetic Medications  . Amoxicillin Rash     ROS  Constitutional: Negative for fever or weight change. Mild scratchy throat since yesterday Respiratory: Negative for cough and shortness of breath.   Cardiovascular: Negative for chest pain or palpitations.  Gastrointestinal: Negative for abdominal pain, no bowel changes.  Musculoskeletal: Negative for gait problem or joint swelling.  Skin: Negative for rash.  Neurological: Negative for dizziness or headache.  No other specific complaints in a complete review of systems (except as listed in HPI above).  Objective  Filed Vitals:   06/30/15 1544  BP: 110/68  Pulse: 97  Temp: 99.1 F (37.3 C)  TempSrc: Oral  Resp: 16  Height: 5\' 6"  (1.676 m)  Weight: 166 lb  12.8 oz (75.66 kg)  SpO2: 97%    Body mass index is 26.94 kg/(m^2).  Physical Exam  Constitutional: Patient appears well-developed and well-nourished.  No distress.  HEENT: head atraumatic, normocephalic, pupils equal and reactive to light, , neck supple, throat within normal limits Cardiovascular: Normal rate, regular rhythm and normal heart sounds.  No murmur heard. No BLE edema. Pulmonary/Chest: Effort normal and breath sounds normal. No respiratory distress. Abdominal: Soft.  There is tenderness, epigastric. Psychiatric: Patient has a normal mood and affect. behavior is normal. Judgment and thought content normal.  Recent Results (from the past 2160 hour(s))  Lipid panel  Status: Abnormal   Collection Time: 06/19/15  8:47 AM  Result Value Ref Range   Cholesterol, Total 186 100 - 199 mg/dL   Triglycerides 403 (H) 0 - 149 mg/dL   HDL 26 (L) >39 mg/dL    Comment: According to ATP-III Guidelines, HDL-C >59 mg/dL is considered a negative risk factor for CHD.    VLDL Cholesterol Cal Comment 5 - 40 mg/dL    Comment: The calculation for the VLDL cholesterol is not valid when triglyceride level is >400 mg/dL.    LDL Calculated Comment 0 - 99 mg/dL    Comment: Triglyceride result indicated is too high for an accurate LDL cholesterol estimation.    Chol/HDL Ratio 7.2 (H) 0.0 - 4.4 ratio units    Comment:                                   T. Chol/HDL Ratio                                             Men  Women                               1/2 Avg.Risk  3.4    3.3                                   Avg.Risk  5.0    4.4                                2X Avg.Risk  9.6    7.1                                3X Avg.Risk 23.4   11.0   Hemoglobin A1c     Status: Abnormal   Collection Time: 06/19/15  8:47 AM  Result Value Ref Range   Hgb A1c MFr Bld 8.5 (H) 4.8 - 5.6 %    Comment:          Pre-diabetes: 5.7 - 6.4          Diabetes: >6.4          Glycemic control for adults with  diabetes: <7.0    Est. average glucose Bld gHb Est-mCnc 197 mg/dL  Comprehensive metabolic panel     Status: Abnormal   Collection Time: 06/19/15  8:47 AM  Result Value Ref Range   Glucose 200 (H) 65 - 99 mg/dL   BUN 15 6 - 24 mg/dL   Creatinine, Ser 0.78 0.57 - 1.00 mg/dL   GFR calc non Af Amer 85 >59 mL/min/1.73   GFR calc Af Amer 98 >59 mL/min/1.73   BUN/Creatinine Ratio 19 9 - 23   Sodium 137 136 - 144 mmol/L    Comment:               **Please note reference interval change**   Potassium 4.4 3.5 - 5.2 mmol/L    Comment:               **Please note reference interval change**  Chloride 95 (L) 97 - 106 mmol/L    Comment:               **Please note reference interval change**   CO2 22 18 - 29 mmol/L   Calcium 9.9 8.7 - 10.2 mg/dL   Total Protein 7.1 6.0 - 8.5 g/dL   Albumin 4.7 3.5 - 5.5 g/dL   Globulin, Total 2.4 1.5 - 4.5 g/dL   Albumin/Globulin Ratio 2.0 1.1 - 2.5   Bilirubin Total 0.4 0.0 - 1.2 mg/dL   Alkaline Phosphatase 137 (H) 39 - 117 IU/L   AST 19 0 - 40 IU/L   ALT 27 0 - 32 IU/L  Hepatitis C antibody     Status: None   Collection Time: 06/19/15  8:47 AM  Result Value Ref Range   Hep C Virus Ab <0.1 0.0 - 0.9 s/co ratio    Comment:                                   Negative:     < 0.8                              Indeterminate: 0.8 - 0.9                                   Positive:     > 0.9  The CDC recommends that a positive HCV antibody result  be followed up with a HCV Nucleic Acid Amplification  test (425956).     PHQ2/9: Depression screen [facility] 2/9 05/20/2015 03/05/2015  Decreased Interest 0 0  Down, Depressed, Hopeless 0 0  PHQ - 2 Score 0 0     Fall Risk: Fall Risk  05/20/2015 03/05/2015  Falls in the past year? No No     Assessment & Plan  1. Secondary diabetes with peripheral neuropathy (HCC)  We will try increasing metformin and resume Levemir, focus on diet also  - Insulin Detemir (LEVEMIR FLEXTOUCH) 100 UNIT/ML Pen; Inject 10 Units into the  skin daily at 10 pm.  Dispense: 45 mL; Refill: 1 - metformin (FORTAMET) 1000 MG (OSM) 24 hr tablet; Take 2 tablets (2,000 mg total) by mouth every evening.  Dispense: 180 tablet; Refill: 1  2. Hyperlipidemia  Lipid panel shows low HDL : to improve HDL patient  needs to eat tree nuts ( pecans/pistachios/almonds ) four times weekly, eat fish two times weekly  and exercise  at least 150 minutes per week, we also have sent prescription of Lipitor and needs to follow a low fat, low sugar diet  3. Gastritis  Continue medications and follow up with Dr. Durwin Reges

## 2015-07-01 ENCOUNTER — Encounter: Payer: Self-pay | Admitting: Family Medicine

## 2015-07-08 ENCOUNTER — Encounter: Payer: Self-pay | Admitting: Family Medicine

## 2015-08-26 ENCOUNTER — Ambulatory Visit
Admission: RE | Admit: 2015-08-26 | Discharge: 2015-08-26 | Disposition: A | Discharge status: Home / Self Care | Payer: 59 | Source: Ambulatory Visit | Attending: Family Medicine | Admitting: Family Medicine

## 2015-08-26 ENCOUNTER — Ambulatory Visit (INDEPENDENT_AMBULATORY_CARE_PROVIDER_SITE_OTHER): Payer: 59 | Admitting: Family Medicine

## 2015-08-26 ENCOUNTER — Encounter: Payer: Self-pay | Admitting: Family Medicine

## 2015-08-26 VITALS — BP 124/58 | HR 116 | Temp 100.7°F | Resp 16 | Ht 66.0 in | Wt 163.9 lb

## 2015-08-26 DIAGNOSIS — J111 Influenza due to unidentified influenza virus with other respiratory manifestations: Secondary | ICD-10-CM | POA: Diagnosis present

## 2015-08-26 DIAGNOSIS — R509 Fever, unspecified: Secondary | ICD-10-CM | POA: Insufficient documentation

## 2015-08-26 LAB — POC INFLUENZA A&B (BINAX/QUICKVUE)
INFLUENZA A, POC: POSITIVE — AB
INFLUENZA B, POC: NEGATIVE

## 2015-08-26 MED ORDER — TUSSIONEX PENNKINETIC ER 10-8 MG/5ML PO SUER: 5.0000 mL | Freq: Two times a day (BID) | ORAL | Status: DC | PRN

## 2015-08-26 MED ORDER — OSELTAMIVIR PHOSPHATE 75 MG PO CAPS: 75.0000 mg | ORAL_CAPSULE | Freq: Two times a day (BID) | ORAL | Status: DC

## 2015-08-26 MED ORDER — ACETAMINOPHEN-CODEINE #3 300-30 MG PO TABS: 1.0000 | ORAL_TABLET | Freq: Four times a day (QID) | ORAL | Status: DC | PRN

## 2015-08-26 NOTE — Patient Instructions (Signed)

## 2015-08-26 NOTE — Progress Notes (Signed)
Name: Brandi Jordan   MRN: [id number]    DOB: [birth year]   Date:08/26/2015       Progress Note  Subjective  Chief Complaint  Chief Complaint  Patient presents with  . URI    onset 4 days and worsening.  syptoms include cough, sore throat, runny nose, left ear pain, body aches and fever.    HPI   Patient is here today with concerns regarding the following symptoms cough that is non-productive, nocturnal, waxing and waning over time with sore throat, congestion, ear pressure, sinus pressure and achiness that started 2 days ago.  Associated with fevers, chills, fatigue and malaise. Not associated with rash, foreign travel, contact with other individuals with similar symptoms, nausea, vomiting, change in bowel movements. Has tried the following home remedies: tylenol, which have not alleviated symptoms.    Past Medical History  Diagnosis Date  . Diabetes mellitus without complication ([place])   . Knee pain   . Anxiety   . Dermatitis   . Overweight   . Dyslipidemia   . B12 deficiency     Social History  Substance Use Topics  . Smoking status: Former Smoker -- 35 years    Types: Cigarettes    Quit date: 06/03/2010  . Smokeless tobacco: Never Used  . Alcohol Use: No     Current outpatient prescriptions:  .  ALPRAZolam (XANAX) 0.5 MG tablet, Take 1 tablet (0.5 mg total) by mouth daily as needed., Disp: 90 tablet, Rfl: 0 .  aspirin 81 MG tablet, Take 81 mg by mouth daily., Disp: , Rfl:  .  atorvastatin (LIPITOR) 40 MG tablet, Take 1 tablet (40 mg total) by mouth daily. (Patient not taking: Reported on 06/22/2015), Disp: 90 tablet, Rfl: 3 .  dicyclomine (BENTYL) 10 MG capsule, Take 1 capsule (10 mg total) by mouth 3 (three) times daily before meals., Disp: 90 capsule, Rfl: 1 .  fluconazole (DIFLUCAN) 150 MG tablet, Take 1 tablet (150 mg total) by mouth once a week., Disp: 12 tablet, Rfl: 0 .  fluticasone (CUTIVATE) 0.05 % cream, APPLY TO AFFECTED AREA TWICE A DAY, Disp: , Rfl: 0 .   hyoscyamine (LEVBID) 0.375 MG 12 hr tablet, Take 1 tablet (0.375 mg total) by mouth every 12 (twelve) hours as needed., Disp: 180 tablet, Rfl: 0 .  Insulin Detemir (LEVEMIR FLEXTOUCH) 100 UNIT/ML Pen, Inject 10 Units into the skin daily at 10 pm., Disp: 45 mL, Rfl: 1 .  metformin (FORTAMET) 1000 MG (OSM) 24 hr tablet, Take 2 tablets (2,000 mg total) by mouth every evening., Disp: 180 tablet, Rfl: 1 .  ondansetron (ZOFRAN) 4 MG tablet, Take 1 tablet (4 mg total) by mouth every 8 (eight) hours as needed for nausea or vomiting., Disp: 20 tablet, Rfl: 0 .  ONETOUCH VERIO test strip, , Disp: , Rfl:  .  pantoprazole (PROTONIX) 40 MG tablet, Take 1 tablet by mouth  daily, Disp: 90 tablet, Rfl: 0  Allergies  Allergen Reactions  . Bydureon [Exenatide] Nausea And Vomiting  . Effexor Xr [Venlafaxine Hcl Er] Nausea And Vomiting and Other (See Comments)    "felt weird"  . Wilder Glade [Dapagliflozin] Other (See Comments)    headache  . Invokana [Canagliflozin] Other (See Comments)    vaginitis  . Kombiglyze [Saxagliptin-Metformin Er] Diarrhea and Other (See Comments)    dizziness  . Other Other (See Comments) and Nausea Only    Diabetic Medications  . Amoxicillin Rash    ROS  Positive for fatigue, increased body temperature,  nasal congestion, cough as mentioned in HPI, otherwise all systems reviewed and are negative.  Objective  Filed Vitals:   08/26/15 1612  BP: 124/58  Pulse: 116  Temp: 100.7 F (38.2 C)  TempSrc: Oral  Resp: 16  Height: 5\' 6"  (1.676 m)  Weight: 163 lb 14.4 oz (74.345 kg)  SpO2: 97%   Body mass index is 26.47 kg/(m^2).   Physical Exam  Constitutional: Patient appears well-developed and well-nourished. In no acute distress but does appear to be fatigued from acute illness. HEENT:  - Head: Normocephalic and atraumatic.  - Ears: RIGHT TM bulging with minimal clear exudate, LEFT TM bulging with minimal clear exudate.  - Nose: Nasal mucosa boggy and congested.  -  Mouth/Throat: Oropharynx is moist with slight erythema of bilateral tonsils without hypertrophy or exudates. Post nasal drainage present.  - Eyes: Conjunctivae clear, EOM movements normal. PERRLA. No scleral icterus.  Neck: Normal range of motion. Neck supple. No thyromegaly present. No local lymphadenopathy. Cardiovascular: Regular rate, regular rhythm with no murmurs heard.  Pulmonary/Chest: Effort normal and breath sounds clear.  Skin: Skin is warm and dry. No rash noted. Psychiatric: Patient has a stable mood and affect. Behavior is normal in office today. Judgment and thought content normal in office today.   Assessment & Plan  1. Flu syndrome Etiology confirmed to be Influenza. Instructed patient on increasing hydration, nasal saline spray use prn, steam inhalation prn, cough medicine prn (either Delsym OTC or prescription if provided), NSAID prn if tolerated and not contraindicated. Tamiflu therapy initiated today and prophylactic dose prescription given to house hold member (husband) who is also a patient at our clinic, Mr. Hinda Lenis. Hyneman (DOB 04/09/1959). Will get CXR to rule out concomitant PNA.   - POC Influenza A&B (Binax test) - oseltamivir (TAMIFLU) 75 MG capsule; Take 1 capsule (75 mg total) by mouth 2 (two) times daily.  Dispense: 10 capsule; Refill: 0 - TUSSIONEX PENNKINETIC ER 10-8 MG/5ML SUER; Take 5 mLs by mouth every 12 (twelve) hours as needed for cough.  Dispense: 115 mL; Refill: 0 - acetaminophen-codeine (TYLENOL #3) 300-30 MG tablet; Take 1 tablet by mouth every 6 (six) hours as needed for moderate pain.  Dispense: 30 tablet; Refill: 0 - DG Chest 2 View; Future

## 2015-08-27 ENCOUNTER — Encounter: Payer: Self-pay | Admitting: Family Medicine

## 2015-09-01 ENCOUNTER — Encounter: Payer: Self-pay | Admitting: Family Medicine

## 2015-09-01 ENCOUNTER — Ambulatory Visit (INDEPENDENT_AMBULATORY_CARE_PROVIDER_SITE_OTHER): Payer: 59 | Admitting: Family Medicine

## 2015-09-01 VITALS — BP 112/72 | HR 72 | Temp 98.2°F | Resp 16 | Wt 167.4 lb

## 2015-09-01 DIAGNOSIS — B373 Candidiasis of vulva and vagina: Secondary | ICD-10-CM

## 2015-09-01 DIAGNOSIS — E1140 Type 2 diabetes mellitus with diabetic neuropathy, unspecified: Secondary | ICD-10-CM | POA: Diagnosis not present

## 2015-09-01 DIAGNOSIS — K2970 Gastritis, unspecified, without bleeding: Secondary | ICD-10-CM

## 2015-09-01 DIAGNOSIS — I700 Atherosclerosis of aorta: Secondary | ICD-10-CM | POA: Diagnosis not present

## 2015-09-01 DIAGNOSIS — IMO0002 Reserved for concepts with insufficient information to code with codable children: Secondary | ICD-10-CM

## 2015-09-01 DIAGNOSIS — E1165 Type 2 diabetes mellitus with hyperglycemia: Secondary | ICD-10-CM | POA: Diagnosis not present

## 2015-09-01 DIAGNOSIS — F411 Generalized anxiety disorder: Secondary | ICD-10-CM

## 2015-09-01 DIAGNOSIS — B3731 Acute candidiasis of vulva and vagina: Secondary | ICD-10-CM

## 2015-09-01 DIAGNOSIS — E538 Deficiency of other specified B group vitamins: Secondary | ICD-10-CM | POA: Diagnosis not present

## 2015-09-01 DIAGNOSIS — E785 Hyperlipidemia, unspecified: Secondary | ICD-10-CM

## 2015-09-01 MED ORDER — ATORVASTATIN CALCIUM 40 MG PO TABS: 40.0000 mg | ORAL_TABLET | Freq: Every day | ORAL | Status: DC

## 2015-09-01 MED ORDER — FLUCONAZOLE 150 MG PO TABS: 150.0000 mg | ORAL_TABLET | ORAL | Status: DC

## 2015-09-01 MED ORDER — ALPRAZOLAM 0.5 MG PO TABS: 0.5000 mg | ORAL_TABLET | Freq: Every day | ORAL | Status: DC | PRN

## 2015-09-01 MED ORDER — PANTOPRAZOLE SODIUM 40 MG PO TBEC: 40.0000 mg | DELAYED_RELEASE_TABLET | Freq: Every day | ORAL | Status: DC

## 2015-09-01 NOTE — Progress Notes (Signed)
Name: Brandi Jordan   MRN: [id number]    DOB: 1961-07-11   Date:09/01/2015       Progress Note  Subjective  Chief Complaint  Chief Complaint  Patient presents with  . Diabetes    patient check her blood sugar daily. it averages at 226 in the last 7 days.  . Hyperlipidemia  . GI Problem    patient states that her stomach still hurts but not as bad as it did     HPI  DMII: hgbA1C was 8.5% 2 months ago, glucose at home averaging 226. She is taking Metformin ER once daily, she was unable to take two daily because it caused her stomach to hurt, she has occasionally diarrhea, she took Tanzeum but stopped because of nausea ( later on diagnosed with gastritis by GI - but scared to take medication). She is back on Levemir 22 units daily but glucose is still elevated. She states she is compliant with a diabetic diet.   Hyperlipidemia: we gave her Atorvastatin, but she never received it from mail order, discussed possible side effects of medication. We will hold off on rechecking labs until she starts statin therapy   Gastritis: she has seen Dr. Durwin Reges, now on Protonix, has not filled Bentyl yet, nausea has resolved but still has epigastric pain and it is intermittent now , no longer has daily symptoms  Atherosclerosis of Aorta: discussed importance of statin therapy and aspirin  Patient Active Problem List   Diagnosis Date Noted  . Fatty liver 05/20/2015  . Calcification of aorta (HCC) 05/20/2015  . Recurrent vaginitis 05/20/2015  . Hiatal hernia 05/20/2015  . Trigger finger 05/11/2015  . Internal hemorrhoids without complication [id number]  . Type 2 diabetes, uncontrolled, with neuropathy ([place]) 03/05/2015  . Anxiety, generalized 03/05/2015  . B12 deficiency 03/05/2015  . Hyperlipidemia 03/05/2015  . Gastritis   . Benign neoplasm of ascending colon     Past Surgical History  Procedure Laterality Date  . Colonoscopy  09/01/2010    Iftikhar-diverticulosis only  . Partial hysterectomy   1990    Partial  . Abdominal hysterectomy    . Colonoscopy with propofol N/A 03/03/2015    Procedure: COLONOSCOPY WITH PROPOFOL;  Surgeon: Lucilla Lame, MD;  Location: [facility] ENDOSCOPY;  Service: Endoscopy;  Laterality: N/A;  . Esophagogastroduodenoscopy (egd) with propofol N/A 03/03/2015    Procedure: ESOPHAGOGASTRODUODENOSCOPY (EGD) WITH PROPOFOL;  Surgeon: Lucilla Lame, MD;  Location: [facility] ENDOSCOPY;  Service: Endoscopy;  Laterality: N/A;  . Breast biopsy Right     rt cyst removed    Family History  Problem Relation Age of Onset  . Cholecystitis Mother   . Kidney failure Mother   . Hypothyroidism Mother   . Cancer Father     lung  . Leukemia Sister     chronic lymphocytic  . Colon cancer Maternal Aunt 71  . Liver disease Neg Hx   . Hypothyroidism Daughter   . Cancer Maternal Aunt     Breast  . Breast cancer Maternal Aunt     Social History   Social History  . Marital Status: Married    Spouse Name: N/A  . Number of Children: 1  . Years of Education: N/A   Occupational History  . customer service [place] History Main Topics  . Smoking status: Former Smoker -- 35 years    Types: Cigarettes    Quit date: 06/03/2010  . Smokeless tobacco: Never Used  . Alcohol Use: No  . Drug Use:  No  . Sexual Activity: Not Currently   Other Topics Concern  . Not on file   Social History Narrative   Lives with husband, 1 healthy grown daughter, works in Therapist, art at [employer] outpatient prescriptions:  .  ALPRAZolam (XANAX) 0.5 MG tablet, Take 1 tablet (0.5 mg total) by mouth daily as needed., Disp: 90 tablet, Rfl: 0 .  Insulin Detemir (LEVEMIR FLEXTOUCH) 100 UNIT/ML Pen, Inject 10 Units into the skin daily at 10 pm., Disp: 45 mL, Rfl: 1 .  metformin (FORTAMET) 1000 MG (OSM) 24 hr tablet, Take 2 tablets (2,000 mg total) by mouth every evening., Disp: 180 tablet, Rfl: 1 .  ONETOUCH VERIO test strip, , Disp: , Rfl:  .  pantoprazole (PROTONIX) 40 MG tablet,  Take 1 tablet (40 mg total) by mouth daily., Disp: 90 tablet, Rfl: 1 .  acetaminophen-codeine (TYLENOL #3) 300-30 MG tablet, Take 1 tablet by mouth every 6 (six) hours as needed for moderate pain. (Patient not taking: Reported on 09/01/2015), Disp: 30 tablet, Rfl: 0 .  aspirin 81 MG tablet, Take 81 mg by mouth daily. Reported on 09/01/2015, Disp: , Rfl:  .  atorvastatin (LIPITOR) 40 MG tablet, Take 1 tablet (40 mg total) by mouth daily., Disp: 90 tablet, Rfl: 1 .  dicyclomine (BENTYL) 10 MG capsule, Take 1 capsule (10 mg total) by mouth 3 (three) times daily before meals. (Patient not taking: Reported on 09/01/2015), Disp: 90 capsule, Rfl: 1 .  fluconazole (DIFLUCAN) 150 MG tablet, Take 1 tablet (150 mg total) by mouth once a week., Disp: 12 tablet, Rfl: 1 .  hyoscyamine (LEVBID) 0.375 MG 12 hr tablet, Take 1 tablet (0.375 mg total) by mouth every 12 (twelve) hours as needed. (Patient not taking: Reported on 09/01/2015), Disp: 180 tablet, Rfl: 0 .  ondansetron (ZOFRAN) 4 MG tablet, Take 1 tablet (4 mg total) by mouth every 8 (eight) hours as needed for nausea or vomiting. (Patient not taking: Reported on 09/01/2015), Disp: 20 tablet, Rfl: 0  Allergies  Allergen Reactions  . Bydureon [Exenatide] Nausea And Vomiting  . Effexor Xr [Venlafaxine Hcl Er] Nausea And Vomiting and Other (See Comments)    "felt weird"  . Wilder Glade [Dapagliflozin] Other (See Comments)    headache  . Invokana [Canagliflozin] Other (See Comments)    vaginitis  . Kombiglyze [Saxagliptin-Metformin Er] Diarrhea and Other (See Comments)    dizziness  . Other Other (See Comments) and Nausea Only    Diabetic Medications  . Amoxicillin Rash     ROS  Constitutional: Negative for fever ( but having more chills/hot flashes since diagnosed with the flu last week )  or weight change.  Respiratory: Positive  for cough - clear sputum, occasionally has  shortness of breath.   Cardiovascular: Negative for chest pain or palpitations.   Gastrointestinal: Positive  for abdominal pain, no bowel changes.  Musculoskeletal: Negative for gait problem or joint swelling.  Skin: Negative for rash.  Neurological: Positive  for dizziness and  Headache today .  No other specific complaints in a complete review of systems (except as listed in HPI above).  Objective  Filed Vitals:   09/01/15 1507  BP: 112/72  Pulse: 72  Temp: 98.2 F (36.8 C)  TempSrc: Oral  Resp: 16  Weight: 167 lb 6.4 oz (75.932 kg)  SpO2: 97%    Body mass index is 27.03 kg/(m^2).  Physical Exam  Constitutional: Patient appears well-developed and well-nourished. Obese  No distress.  HEENT: head atraumatic, normocephalic, pupils equal and reactive to light, ears TM normal bilateral, neck supple, throat within normal limits Cardiovascular: Normal rate, regular rhythm and normal heart sounds.  No murmur heard. No BLE edema. Pulmonary/Chest: Effort normal and breath sounds normal. No respiratory distress. Abdominal: Soft.  There is no tenderness. Psychiatric: Patient has a normal mood and affect. behavior is normal. Judgment and thought content normal.  Recent Results (from the past 2160 hour(s))  Lipid panel     Status: Abnormal   Collection Time: 06/19/15  8:47 AM  Result Value Ref Range   Cholesterol, Total 186 100 - 199 mg/dL   Triglycerides 403 (H) 0 - 149 mg/dL   HDL 26 (L) >39 mg/dL    Comment: According to ATP-III Guidelines, HDL-C >59 mg/dL is considered a negative risk factor for CHD.    VLDL Cholesterol Cal Comment 5 - 40 mg/dL    Comment: The calculation for the VLDL cholesterol is not valid when triglyceride level is >400 mg/dL.    LDL Calculated Comment 0 - 99 mg/dL    Comment: Triglyceride result indicated is too high for an accurate LDL cholesterol estimation.    Chol/HDL Ratio 7.2 (H) 0.0 - 4.4 ratio units    Comment:                                   T. Chol/HDL Ratio                                             Men  Women                                1/2 Avg.Risk  3.4    3.3                                   Avg.Risk  5.0    4.4                                2X Avg.Risk  9.6    7.1                                3X Avg.Risk 23.4   11.0   Hemoglobin A1c     Status: Abnormal   Collection Time: 06/19/15  8:47 AM  Result Value Ref Range   Hgb A1c MFr Bld 8.5 (H) 4.8 - 5.6 %    Comment:          Pre-diabetes: 5.7 - 6.4          Diabetes: >6.4          Glycemic control for adults with diabetes: <7.0    Est. average glucose Bld gHb Est-mCnc 197 mg/dL  Comprehensive metabolic panel     Status: Abnormal   Collection Time: 06/19/15  8:47 AM  Result Value Ref Range   Glucose 200 (H) 65 - 99 mg/dL   BUN 15 6 - 24 mg/dL   Creatinine, Ser 0.78 0.57 - 1.00 mg/dL   GFR calc non Af  Amer 85 >59 mL/min/1.73   GFR calc Af Amer 98 >59 mL/min/1.73   BUN/Creatinine Ratio 19 9 - 23   Sodium 137 136 - 144 mmol/L    Comment:               **Please note reference interval change**   Potassium 4.4 3.5 - 5.2 mmol/L    Comment:               **Please note reference interval change**   Chloride 95 (L) 97 - 106 mmol/L    Comment:               **Please note reference interval change**   CO2 22 18 - 29 mmol/L   Calcium 9.9 8.7 - 10.2 mg/dL   Total Protein 7.1 6.0 - 8.5 g/dL   Albumin 4.7 3.5 - 5.5 g/dL   Globulin, Total 2.4 1.5 - 4.5 g/dL   Albumin/Globulin Ratio 2.0 1.1 - 2.5   Bilirubin Total 0.4 0.0 - 1.2 mg/dL   Alkaline Phosphatase 137 (H) 39 - 117 IU/L   AST 19 0 - 40 IU/L   ALT 27 0 - 32 IU/L  Hepatitis C antibody     Status: None   Collection Time: 06/19/15  8:47 AM  Result Value Ref Range   Hep C Virus Ab <0.1 0.0 - 0.9 s/co ratio    Comment:                                   Negative:     < 0.8                              Indeterminate: 0.8 - 0.9                                   Positive:     > 0.9  The CDC recommends that a positive HCV antibody result  be followed up with a HCV Nucleic Acid Amplification   test [id number]).   POC Influenza A&B (Binax test)     Status: Abnormal   Collection Time: 08/26/15  4:15 PM  Result Value Ref Range   Influenza A, POC Positive (A) Negative   Influenza B, POC Negative Negative    PHQ2/9: Depression screen [facility] 2/9 09/01/2015 08/26/2015 05/20/2015 03/05/2015  Decreased Interest 0 0 0 0  Down, Depressed, Hopeless 0 0 0 0  PHQ - 2 Score 0 0 0 0     Fall Risk: Fall Risk  09/01/2015 08/26/2015 05/20/2015 03/05/2015  Falls in the past year? No No No No     Functional Status Survey: Is the patient deaf or have difficulty hearing?: No Does the patient have difficulty seeing, even when wearing glasses/contacts?: Yes (glasess) Does the patient have difficulty concentrating, remembering, or making decisions?: No Does the patient have difficulty walking or climbing stairs?: No Does the patient have difficulty dressing or bathing?: No Does the patient have difficulty doing errands alone such as visiting a doctor's office or shopping?: No    Assessment & Plan  1. Dyslipidemia  - atorvastatin (LIPITOR) 40 MG tablet; Take 1 tablet (40 mg total) by mouth daily.  Dispense: 90 tablet; Refill: 1  2. Anxiety, generalized  - ALPRAZolam (XANAX) 0.5 MG tablet; Take 1 tablet (0.5 mg  total) by mouth daily as needed.  Dispense: 90 tablet; Refill: 0  3. Type 2 diabetes, uncontrolled, with neuropathy (HCC)  - Hemoglobin A1c due in 3 weeks  4. Calcification of aorta (HCC)  Explained importance of statin therapy and baby aspirin daily   5. B12 deficiency  Taking otc B12 supplementation   6. Gastritis  - pantoprazole (PROTONIX) 40 MG tablet; Take 1 tablet (40 mg total) by mouth daily.  Dispense: 90 tablet; Refill: 1  7. Recurrent candidiasis of vagina  - fluconazole (DIFLUCAN) 150 MG tablet; Take 1 tablet (150 mg total) by mouth once a week.  Dispense: 12 tablet; Refill: 1

## 2015-09-29 ENCOUNTER — Telehealth: Payer: Self-pay | Admitting: Family Medicine

## 2015-09-29 NOTE — Telephone Encounter (Signed)
Patient states that her insurance denied Levimer and was told for you to file an appeal. She think it was taken off there list. [facility] is wanting her to try Lantus or Toujeo if you do not want to appeal it. But patient would really like to stay with the levimer.

## 2015-09-29 NOTE — Telephone Encounter (Signed)
Shelly,  Please see if you can get a PA Thank you

## 2015-10-02 NOTE — Telephone Encounter (Signed)
Sent PA in to [facility], will wait to get a determination to notify the pt

## 2015-10-07 ENCOUNTER — Encounter: Payer: Self-pay | Admitting: Family Medicine

## 2015-10-08 ENCOUNTER — Telehealth: Payer: Self-pay

## 2015-10-08 NOTE — Telephone Encounter (Signed)
Patient stated that they told her that Lantus and Toujeo will be covered, but patient stated she will rather try the Lantus.  I informed her that we will try to get a sample of the Lantus and will give her a call once it comes in.  Rhonda informed me that it has already been ordered and we are waiting for the supply to come in.

## 2015-10-08 NOTE — Telephone Encounter (Signed)
Spoke to [employer] with this [employer] and she informed me that due to Levemir being part of their plan exclusion it will not be covered at all. She stated that even with a peer to peer or an appeal it will not be covered. She stated that other medications on the formulary can be submitted and then they can see if it is covered.  We have samples of Levemir, Trulicity and Tresiba in the lab.

## 2015-10-08 NOTE — Telephone Encounter (Signed)
We need to find out which is one is covered and try a sample first

## 2015-11-20 LAB — HEMOGLOBIN A1C
ESTIMATED AVERAGE GLUCOSE: 214 mg/dL
HEMOGLOBIN A1C: 9.1 % — AB (ref 4.8–5.6)

## 2015-11-30 ENCOUNTER — Ambulatory Visit (INDEPENDENT_AMBULATORY_CARE_PROVIDER_SITE_OTHER): Payer: 59 | Admitting: Family Medicine

## 2015-11-30 ENCOUNTER — Encounter: Payer: Self-pay | Admitting: Family Medicine

## 2015-11-30 VITALS — BP 138/82 | HR 101 | Temp 98.8°F | Resp 16 | Wt 169.2 lb

## 2015-11-30 DIAGNOSIS — K2970 Gastritis, unspecified, without bleeding: Secondary | ICD-10-CM

## 2015-11-30 DIAGNOSIS — R110 Nausea: Secondary | ICD-10-CM | POA: Diagnosis not present

## 2015-11-30 DIAGNOSIS — E1165 Type 2 diabetes mellitus with hyperglycemia: Secondary | ICD-10-CM | POA: Diagnosis not present

## 2015-11-30 DIAGNOSIS — H9312 Tinnitus, left ear: Secondary | ICD-10-CM | POA: Diagnosis not present

## 2015-11-30 DIAGNOSIS — E1140 Type 2 diabetes mellitus with diabetic neuropathy, unspecified: Secondary | ICD-10-CM

## 2015-11-30 DIAGNOSIS — F411 Generalized anxiety disorder: Secondary | ICD-10-CM

## 2015-11-30 DIAGNOSIS — E785 Hyperlipidemia, unspecified: Secondary | ICD-10-CM

## 2015-11-30 DIAGNOSIS — IMO0002 Reserved for concepts with insufficient information to code with codable children: Secondary | ICD-10-CM

## 2015-11-30 MED ORDER — ONDANSETRON HCL 4 MG PO TABS: 4.0000 mg | ORAL_TABLET | Freq: Three times a day (TID) | ORAL | Status: DC | PRN

## 2015-11-30 MED ORDER — METFORMIN HCL ER (OSM) 1000 MG PO TB24: 2000.0000 mg | ORAL_TABLET | Freq: Every evening | ORAL | Status: DC

## 2015-11-30 MED ORDER — INSULIN GLARGINE 100 UNIT/ML SOLOSTAR PEN: 20.0000 [IU] | PEN_INJECTOR | Freq: Every day | SUBCUTANEOUS | Status: DC

## 2015-11-30 NOTE — Addendum Note (Signed)
Addended by: Steele Sizer F on: 11/30/2015 04:01 PM   Modules accepted: Orders

## 2015-11-30 NOTE — Patient Instructions (Signed)
Irritable Bowel Syndrome, Adult Irritable bowel syndrome (IBS) is not one specific disease. It is a group of symptoms that affects the organs responsible for digestion (gastrointestinal or GI tract).  To regulate how your GI tract works, your body sends signals back and forth between your intestines and your brain. If you have IBS, there may be a problem with these signals. As a result, your GI tract does not function normally. Your intestines may become more sensitive and overreact to certain things. This is especially true when you eat certain foods or when you are under stress.  There are four types of IBS. These may be determined based on the consistency of your stool:   IBS with diarrhea.   IBS with constipation.   Mixed IBS.   Unsubtyped IBS.  It is important to know which type of IBS you have. Some treatments are more likely to be helpful for certain types of IBS.  CAUSES  The exact cause of IBS is not known. RISK FACTORS You may have a higher risk of IBS if:  You are a woman.  You are younger than 57 years old.  You have a family history of IBS.  You have mental health problems.  You have had bacterial infection of your GI tract. SIGNS AND SYMPTOMS  Symptoms of IBS vary from person to person. The main symptom is abdominal pain or discomfort. Additional symptoms usually include one or more of the following:   Diarrhea, constipation, or both.   Abdominal swelling or bloating.   Feeling full or sick after eating a small or regular-size meal.   Frequent gas.   Mucus in the stool.   A feeling of having more stool left after a bowel movement.  Symptoms tend to come and go. They may be associated with stress, psychiatric conditions, or nothing at all.  DIAGNOSIS  There is no specific test to diagnose IBS. Your health care provider will make a diagnosis based on a physical exam, medical history, and your symptoms. You may have other tests to rule out other  conditions that may be causing your symptoms. These may include:   Blood tests.   X-rays.   CT scan.  Endoscopy and colonoscopy. This is a test in which your GI tract is viewed with a long, thin, flexible tube. TREATMENT There is no cure for IBS, but treatment can help relieve symptoms. IBS treatment often includes:   Changes to your diet, such as:  Eating more fiber.  Avoiding foods that cause symptoms.  Drinking more water.  Eating regular, medium-sized portioned meals.  Medicines. These may include:  Fiber supplements if you have constipation.  Medicine to control diarrhea (antidiarrheal medicines).  Medicine to help control muscle spasms in your GI tract (antispasmodic medicines).  Medicines to help with any mental health issues, such as antidepressants or tranquilizers.  Therapy.  Talk therapy may help with anxiety, depression, or other mental health issues that can make IBS symptoms worse.  Stress reduction.  Managing your stress can help keep symptoms under control. HOME CARE INSTRUCTIONS   Take medicines only as directed by your health care provider.  Eat a healthy diet.  Avoid foods and drinks with added sugar.  Include more whole grains, fruits, and vegetables gradually into your diet. This may be especially helpful if you have IBS with constipation.  Avoid any foods and drinks that make your symptoms worse. These may include dairy products and caffeinated or carbonated drinks.  Do not eat large meals.    Drink enough fluid to keep your urine clear or pale yellow.  Exercise regularly. Ask your health care provider for recommendations of good activities for you.  Keep all follow-up visits as directed by your health care provider. This is important. SEEK MEDICAL CARE IF:   You have constant pain.  You have trouble or pain with swallowing.  You have worsening diarrhea. SEEK IMMEDIATE MEDICAL CARE IF:   You have severe and worsening abdominal  pain.   You have diarrhea and:   You have a rash, stiff neck, or severe headache.   You are irritable, sleepy, or difficult to awaken.   You are weak, dizzy, or extremely thirsty.   You have bright red blood in your stool or you have black tarry stools.   You have unusual abdominal swelling that is painful.   You vomit continuously.   You vomit blood (hematemesis).   You have both abdominal pain and a fever.    This information is not intended to replace advice given to you by your health care provider. Make sure you discuss any questions you have with your health care provider.   Document Released: 08/15/2005 Document Revised: 09/05/2014 Document Reviewed: 05/02/2014 Elsevier Interactive Patient Education 2016 [employer]  

## 2015-11-30 NOTE — Progress Notes (Signed)
Name: Brandi Jordan   MRN: [id number]    DOB: Mar 14, 1961   Date:11/30/2015       Progress Note  Subjective  Chief Complaint  Chief Complaint  Patient presents with  . Diabetes  . Anxiety  . Dyslipidemia  . B12 Deficiency  . Medication Refill    HPI  DMII: hgbA1C was 8.5% 3 months ago and is now up to 9.1%  glucose average is 229 past 7 days and 239 past 30 days. She is taking Metformin ER once daily, she was unable to take two daily because it caused her stomach to hurt, she has occasionally diarrhea, she took Tanzeum but stopped because of nausea ( later on diagnosed with gastritis by GI - but scared to take medication). She is on Lantus but only 12-14 units per day instead of 20 units because she was afraid she would run out of medication.  . She states she is compliant with a diabetic diet, most of the time. Willing to see Endocrinologist for further management of her DM  Hyperlipidemia:she has been taking Atorvastatin daily and denies side effects  Gastritis: she has seen Dr. Durwin Reges, now on Protonix, not taking Bentyl but is taking Levisin prn,  nausea is getting worse again, sometimes also has lower abdominal pain, periods of diarrhea  Atherosclerosis of Aorta: discussed importance of statin therapy and aspirin  Patient Active Problem List   Diagnosis Date Noted  . Fatty liver 05/20/2015  . Calcification of aorta (HCC) 05/20/2015  . Recurrent vaginitis 05/20/2015  . Hiatal hernia 05/20/2015  . Trigger finger 05/11/2015  . Internal hemorrhoids without complication [id number]  . Type 2 diabetes, uncontrolled, with neuropathy ([place]) 03/05/2015  . Anxiety, generalized 03/05/2015  . B12 deficiency 03/05/2015  . Hyperlipidemia 03/05/2015  . Gastritis   . Benign neoplasm of ascending colon     Past Surgical History  Procedure Laterality Date  . Colonoscopy  09/01/2010    Iftikhar-diverticulosis only  . Partial hysterectomy  1990    Partial  . Abdominal hysterectomy    .  Colonoscopy with propofol N/A 03/03/2015    Procedure: COLONOSCOPY WITH PROPOFOL;  Surgeon: Lucilla Lame, MD;  Location: [facility] ENDOSCOPY;  Service: Endoscopy;  Laterality: N/A;  . Esophagogastroduodenoscopy (egd) with propofol N/A 03/03/2015    Procedure: ESOPHAGOGASTRODUODENOSCOPY (EGD) WITH PROPOFOL;  Surgeon: Lucilla Lame, MD;  Location: [facility] ENDOSCOPY;  Service: Endoscopy;  Laterality: N/A;  . Breast biopsy Right     rt cyst removed    Family History  Problem Relation Age of Onset  . Cholecystitis Mother   . Kidney failure Mother   . Hypothyroidism Mother   . Cancer Father     lung  . Leukemia Sister     chronic lymphocytic  . Colon cancer Maternal Aunt 95  . Liver disease Neg Hx   . Hypothyroidism Daughter   . Cancer Maternal Aunt     Breast  . Breast cancer Maternal Aunt     Social History   Social History  . Marital Status: Married    Spouse Name: N/A  . Number of Children: 1  . Years of Education: N/A   Occupational History  . customer service [place] History Main Topics  . Smoking status: Former Smoker -- 35 years    Types: Cigarettes    Quit date: 06/03/2010  . Smokeless tobacco: Never Used  . Alcohol Use: No  . Drug Use: No  . Sexual Activity: Not Currently   Other Topics Concern  .  Not on file   Social History Narrative   Lives with husband, 1 healthy grown daughter, works in Therapist, art at [employer] outpatient prescriptions:  .  ALPRAZolam (XANAX) 0.5 MG tablet, Take 1 tablet (0.5 mg total) by mouth daily as needed., Disp: 90 tablet, Rfl: 0 .  aspirin 81 MG tablet, Take 81 mg by mouth daily. Reported on 09/01/2015, Disp: , Rfl:  .  atorvastatin (LIPITOR) 40 MG tablet, Take 1 tablet (40 mg total) by mouth daily., Disp: 90 tablet, Rfl: 1 .  dicyclomine (BENTYL) 10 MG capsule, Take 1 capsule (10 mg total) by mouth 3 (three) times daily before meals., Disp: 90 capsule, Rfl: 1 .  fluconazole (DIFLUCAN) 150 MG tablet, Take 1 tablet (150  mg total) by mouth once a week., Disp: 12 tablet, Rfl: 1 .  hyoscyamine (LEVBID) 0.375 MG 12 hr tablet, Take 1 tablet (0.375 mg total) by mouth every 12 (twelve) hours as needed., Disp: 180 tablet, Rfl: 0 .  ONETOUCH VERIO test strip, , Disp: , Rfl:  .  pantoprazole (PROTONIX) 40 MG tablet, Take 1 tablet (40 mg total) by mouth daily., Disp: 90 tablet, Rfl: 1 .  Insulin Glargine (LANTUS SOLOSTAR) 100 UNIT/ML Solostar Pen, Inject 20 Units into the skin daily at 10 pm., Disp: 15 pen, Rfl: 1 .  metformin (FORTAMET) 1000 MG (OSM) 24 hr tablet, Take 2 tablets (2,000 mg total) by mouth every evening., Disp: 180 tablet, Rfl: 1 .  ondansetron (ZOFRAN) 4 MG tablet, Take 1 tablet (4 mg total) by mouth every 8 (eight) hours as needed for nausea or vomiting., Disp: 20 tablet, Rfl: 0  Allergies  Allergen Reactions  . Bydureon [Exenatide] Nausea And Vomiting  . Effexor Xr [Venlafaxine Hcl Er] Nausea And Vomiting and Other (See Comments)    "felt weird"  . Wilder Glade [Dapagliflozin] Other (See Comments)    headache  . Invokana [Canagliflozin] Other (See Comments)    vaginitis  . Kombiglyze [Saxagliptin-Metformin Er] Diarrhea and Other (See Comments)    dizziness  . Other Other (See Comments) and Nausea Only    Diabetic Medications  . Amoxicillin Rash     ROS  Ten systems reviewed and is negative except as mentioned in HPI   Objective  Filed Vitals:   11/30/15 1506  BP: 152/82  Pulse: 101  Temp: 98.8 F (37.1 C)  TempSrc: Oral  Resp: 16  Weight: 169 lb 3.2 oz (76.749 kg)  SpO2: 97%    Body mass index is 27.32 kg/(m^2).  Physical Exam  Constitutional: Patient appears well-developed and well-nourished. Obese No distress.  HEENT: head atraumatic, normocephalic, pupils equal and reactive to light,neck supple, throat within normal limits Cardiovascular: Normal rate, regular rhythm and normal heart sounds.  No murmur heard. No BLE edema. Pulmonary/Chest: Effort normal and breath sounds  normal. No respiratory distress. Abdominal: Soft.  There is tenderness during palpation of epigastric area Psychiatric: Patient has a normal mood and affect. behavior is normal. Judgment and thought content normal.  Recent Results (from the past 2160 hour(s))  Hemoglobin A1c     Status: Abnormal   Collection Time: 11/20/15  8:31 AM  Result Value Ref Range   Hgb A1c MFr Bld 9.1 (H) 4.8 - 5.6 %    Comment:          Pre-diabetes: 5.7 - 6.4          Diabetes: >6.4          Glycemic control for adults  with diabetes: <7.0    Est. average glucose Bld gHb Est-mCnc 214 mg/dL     PHQ2/9: Depression screen [facility] 2/9 11/30/2015 09/01/2015 08/26/2015 05/20/2015 03/05/2015  Decreased Interest 0 0 0 0 0  Down, Depressed, Hopeless 0 0 0 0 0  PHQ - 2 Score 0 0 0 0 0     Fall Risk: Fall Risk  11/30/2015 09/01/2015 08/26/2015 05/20/2015 03/05/2015  Falls in the past year? No No No No No     Functional Status Survey: Is the patient deaf or have difficulty hearing?: No Does the patient have difficulty seeing, even when wearing glasses/contacts?: No Does the patient have difficulty concentrating, remembering, or making decisions?: No Does the patient have difficulty walking or climbing stairs?: No Does the patient have difficulty dressing or bathing?: No Does the patient have difficulty doing errands alone such as visiting a doctor's office or shopping?: No   Assessment & Plan  1. Type 2 diabetes, uncontrolled, with neuropathy ([place])  Possible diabetic gastroparesis, versus IBS - we will give her information about it - Insulin Glargine (LANTUS SOLOSTAR) 100 UNIT/ML Solostar Pen; Inject 20 Units into the skin daily at 10 pm.  Dispense: 15 pen; Refill: 1 - metformin (FORTAMET) 1000 MG (OSM) 24 hr tablet; Take 2 tablets (2,000 mg total) by mouth every evening.  Dispense: 180 tablet; Refill: 1 - Ambulatory referral to Endocrinology  2. Anxiety, generalized  Taking medication prn only , still has stress at  work  3. Hyperlipidemia  Continue medication  4. Gastritis  - ondansetron (ZOFRAN) 4 MG tablet; Take 1 tablet (4 mg total) by mouth every 8 (eight) hours as needed for nausea or vomiting.  Dispense: 20 tablet; Refill: 0  5. Nausea  - ondansetron (ZOFRAN) 4 MG tablet; Take 1 tablet (4 mg total) by mouth every 8 (eight) hours as needed for nausea or vomiting.  Dispense: 20 tablet; Refill: 0  6. Tinnitus, left  Started 2 days ago, intermittent , no hearing loss, she wants to hold off on referral to ENT at this time

## 2015-12-02 ENCOUNTER — Telehealth: Payer: Self-pay | Admitting: Family Medicine

## 2015-12-02 DIAGNOSIS — E1165 Type 2 diabetes mellitus with hyperglycemia: Principal | ICD-10-CM

## 2015-12-02 DIAGNOSIS — E1140 Type 2 diabetes mellitus with diabetic neuropathy, unspecified: Secondary | ICD-10-CM

## 2015-12-02 DIAGNOSIS — IMO0002 Reserved for concepts with insufficient information to code with codable children: Secondary | ICD-10-CM

## 2015-12-02 MED ORDER — INSULIN GLARGINE 100 UNIT/ML SOLOSTAR PEN: 20.0000 [IU] | PEN_INJECTOR | Freq: Every day | SUBCUTANEOUS | Status: DC

## 2015-12-02 NOTE — Telephone Encounter (Signed)
Patient stated at last visit that you where going to send Lantus to her mail order company and that you would also send one Lantus Pen to her local pharmacy [facility]. She is completely out

## 2015-12-02 NOTE — Telephone Encounter (Signed)
done

## 2015-12-08 ENCOUNTER — Other Ambulatory Visit: Payer: Self-pay | Admitting: Family Medicine

## 2015-12-08 NOTE — Telephone Encounter (Signed)
Patient requesting refill. 

## 2015-12-24 ENCOUNTER — Ambulatory Visit (INDEPENDENT_AMBULATORY_CARE_PROVIDER_SITE_OTHER): Payer: 59 | Admitting: Gastroenterology

## 2015-12-24 ENCOUNTER — Ambulatory Visit: Payer: 59 | Admitting: Gastroenterology

## 2015-12-24 ENCOUNTER — Encounter: Payer: Self-pay | Admitting: Gastroenterology

## 2015-12-24 VITALS — BP 150/82 | HR 84 | Temp 98.8°F | Ht 66.0 in | Wt 169.0 lb

## 2015-12-24 DIAGNOSIS — K580 Irritable bowel syndrome with diarrhea: Secondary | ICD-10-CM | POA: Diagnosis not present

## 2015-12-24 NOTE — Progress Notes (Signed)
Primary Care Physician: Loistine Chance, MD  Primary Gastroenterologist:  Dr. Lucilla Lame  Chief Complaint  Patient presents with  . Abdominal Pain  . Nausea    HPI: Brandi Jordan is a 55 y.o. female here for continued symptoms of irritable bowel syndrome. The patient reports that her abdominal pain when she moves. She also states it is worse when she bends over. The patient has not had any weight loss. She continues to have diarrhea with bloating and abdominal pain. The patient also has nausea with she states can happen and any time and is usually associated with her pain. The patient could not handle dicyclomine due to the side effects.     Current Outpatient Prescriptions  Medication Sig Dispense Refill  . ALPRAZolam (XANAX) 0.5 MG tablet Take 1 tablet (0.5 mg total) by mouth daily as needed. 90 tablet 0  . aspirin 81 MG tablet Take 81 mg by mouth daily. Reported on 09/01/2015    . atorvastatin (LIPITOR) 40 MG tablet Take 1 tablet (40 mg total) by mouth daily. 90 tablet 1  . dicyclomine (BENTYL) 10 MG capsule Take 1 capsule (10 mg total) by mouth 3 (three) times daily before meals. 90 capsule 1  . fluconazole (DIFLUCAN) 150 MG tablet Take 1 tablet (150 mg total) by mouth once a week. 12 tablet 1  . hyoscyamine (LEVBID) 0.375 MG 12 hr tablet Take 1 tablet (0.375 mg total) by mouth every 12 (twelve) hours as needed. 180 tablet 0  . Insulin Glargine (LANTUS SOLOSTAR) 100 UNIT/ML Solostar Pen Inject 20 Units into the skin daily at 10 pm. 5 pen 0  . metformin (FORTAMET) 1000 MG (OSM) 24 hr tablet Take 2 tablets (2,000 mg total) by mouth every evening. 180 tablet 1  . ondansetron (ZOFRAN) 4 MG tablet Take 1 tablet (4 mg total) by mouth every 8 (eight) hours as needed for nausea or vomiting. 20 tablet 0  . ONETOUCH VERIO test strip     . pantoprazole (PROTONIX) 40 MG tablet Take 1 tablet by mouth  daily 90 tablet 1   No current facility-administered medications for this visit.     Allergies as of 12/24/2015 - Review Complete 11/30/2015  Allergen Reaction Noted  . Bydureon [exenatide] Nausea And Vomiting 06/30/2015  . Effexor xr [venlafaxine hcl er] Nausea And Vomiting and Other (See Comments) 02/02/2015  . Wilder Glade [dapagliflozin] Other (See Comments) 02/02/2015  . Invokana [canagliflozin] Other (See Comments) 02/02/2015  . Kombiglyze [saxagliptin-metformin er] Diarrhea and Other (See Comments) 02/02/2015  . Other Other (See Comments) and Nausea Only 06/22/2015  . Amoxicillin Rash 01/29/2015    ROS:  General: Negative for anorexia, weight loss, fever, chills, fatigue, weakness. ENT: Negative for hoarseness, difficulty swallowing , nasal congestion. CV: Negative for chest pain, angina, palpitations, dyspnea on exertion, peripheral edema.  Respiratory: Negative for dyspnea at rest, dyspnea on exertion, cough, sputum, wheezing.  GI: See history of present illness. GU:  Negative for dysuria, hematuria, urinary incontinence, urinary frequency, nocturnal urination.  Endo: Negative for unusual weight change.    Physical Examination:   BP 150/82 mmHg  Pulse 84  Temp(Src) 98.8 F (37.1 C) (Oral)  Ht 5\' 6"  (1.676 m)  Wt 169 lb (76.658 kg)  BMI 27.29 kg/m2  General: Well-nourished, well-developed in no acute distress.  Eyes: No icterus. Conjunctivae pink. Mouth: Oropharyngeal mucosa moist and pink , no lesions erythema or exudate. Lungs: Clear to auscultation bilaterally. Non-labored. Heart: Regular rate and rhythm, no murmurs rubs or  gallops.  Abdomen: Bowel sounds are normal, Tender around the patient umbilical hernia which is easily reducible, nondistended, no hepatosplenomegaly or masses, no abdominal bruits or hernia , no rebound or guarding.   Extremities: No lower extremity edema. No clubbing or deformities. Neuro: Alert and oriented x 3.  Grossly intact. Skin: Warm and dry, no jaundice.   Psych: Alert and cooperative, normal mood and affect.  Labs:     Imaging Studies: No results found.  Assessment and Plan:   Brandi Jordan is a 55 y.o. y/o female who comes in with continued symptoms of irritable bowel syndrome with diarrhea and nausea. Despite this the patient has not lost any weight. She could not tolerate the side effects of dicyclomine. She will be started on a probiotic. If this does not help she may need to be tried on Xifaxin or possibly cymbolta.  The patient's colonoscopy and EGD did not show cause for the patient Symptoms nor did the patient's CAT scan of her abdomen. The patient has also been explained that abdominal pain and a musculoskeletal component. The patient has been explained the plan and agrees with it.   Note: This dictation was prepared with Dragon dictation along with smaller phrase technology. Any transcriptional errors that result from this process are unintentional.

## 2016-01-26 ENCOUNTER — Encounter: Payer: 59 | Attending: Internal Medicine | Admitting: *Deleted

## 2016-01-26 ENCOUNTER — Encounter: Payer: Self-pay | Admitting: *Deleted

## 2016-01-26 VITALS — BP 124/62 | Ht 64.0 in | Wt 168.4 lb

## 2016-01-26 DIAGNOSIS — E119 Type 2 diabetes mellitus without complications: Secondary | ICD-10-CM | POA: Diagnosis present

## 2016-01-26 DIAGNOSIS — Z794 Long term (current) use of insulin: Secondary | ICD-10-CM

## 2016-01-26 NOTE — Progress Notes (Signed)
Diabetes Self-Management Education  Visit Type: First/Initial  Appt. Start Time: 1600 Appt. End Time: 1700  01/26/2016  [name], identified by name and date of birth, is a 55 y.o. female with a diagnosis of Diabetes: Type 2.   ASSESSMENT  Blood pressure 124/62, height 5\' 4"  (1.626 m), weight 168 lb 6.4 oz (76.386 kg). Body mass index is 28.89 kg/(m^2).      Diabetes Self-Management Education - 01/26/16 1801    Visit Information   Visit Type First/Initial   Initial Visit   Diabetes Type Type 2   Are you currently following a meal plan? No   Are you taking your medications as prescribed? Yes   Date Diagnosed at least 2 years ago   Health Coping   How would you rate your overall health? Fair   Psychosocial Assessment   Patient Belief/Attitude about Diabetes Defeat/Burnout  "awfu;"   Self-care barriers None   Self-management support Doctor's office   Patient Concerns Nutrition/Meal planning;Medication;Weight Control;Healthy Lifestyle;Glycemic Control;Problem Solving;Monitoring   Preferred Learning Style Hands on;Auditory   Learning Readiness Ready   How often do you need to have someone help you when you read instructions, pamphlets, or other written materials from your doctor or pharmacy? 1 - Never   What is the last grade level you completed in school? 12th   Pre-Education Assessment   Patient understands the diabetes disease and treatment process. Needs Review   Patient understands incorporating nutritional management into lifestyle. Needs Review   Patient undertands incorporating physical activity into lifestyle. Needs Review   Patient understands using medications safely. Needs Review   Patient understands monitoring blood glucose, interpreting and using results Needs Review   Patient understands prevention, detection, and treatment of acute complications. Needs Instruction   Patient understands prevention, detection, and treatment of chronic complications. Needs  Review   Patient understands how to develop strategies to address psychosocial issues. Needs Instruction   Patient understands how to develop strategies to promote health/change behavior. Needs Instruction   Complications   Last HgB A1C per patient/outside source 9.1 %  11/20/15   How often do you check your blood sugar? 1-2 times/day   Fasting Blood glucose range (mg/dL) 180-200;>200  FBG's 180-256 mg/dL   Have you had a dilated eye exam in the past 12 months? No   Have you had a dental exam in the past 12 months? Yes   Are you checking your feet? Yes   How many days per week are you checking your feet? 7   Dietary Intake   Breakfast Peanut butter crackers and Activa yogurt   Snack (morning) fruit, chocolate   Lunch grilled chicken salad; sub and soup; grilled chicken and potatoes with green beans   Snack (afternoon) fruit, chocolate   Dinner spaghetti and bread; grilled meats and veggies; pizza   Snack (evening) ice cream   Beverage(s) Cyrstal Lite and coffee   Exercise   Exercise Type ADL's   Patient Education   Previous Diabetes Education Yes (please comment)  Pt attend classes at [facility] before.   Disease state  Factors that contribute to the development of diabetes;Definition of diabetes, type 1 and 2, and the diagnosis of diabetes   Nutrition management  Food label reading, portion sizes and measuring food.   Physical activity and exercise  Role of exercise on diabetes management, blood pressure control and cardiac health.   Medications Taught/reviewed insulin injection, site rotation, insulin storage and needle disposal.;Reviewed patients medication for diabetes, action, purpose, timing  of dose and side effects.   Monitoring Purpose and frequency of SMBG.;Identified appropriate SMBG and/or A1C goals.   Acute complications Taught treatment of hypoglycemia - the 15 rule.   Chronic complications Relationship between chronic complications and blood glucose control   Psychosocial  adjustment Role of stress on diabetes;Identified and addressed patients feelings and concerns about diabetes   Individualized Goals (developed by patient)   Reducing Risk Improve blood sugars Decrease medications Prevent diabetes complications Lose weight Lead a healthier lifestyle Become more fit   Outcomes   Expected Outcomes Demonstrated interest in learning. Expect positive outcomes   Future DMSE 4-6 wks      Individualized Plan for Diabetes Self-Management Training:   Learning Objective:  Patient will have a greater understanding of diabetes self-management. Patient education plan is to attend individual and/or group sessions per assessed needs and concerns.   Plan:   Patient Instructions  Check blood sugars 1 x day before breakfast or 2  hrs after supper every day Exercise: Begin walking for  10-15  minutes  3 days a week and gradually increase to 150 minutes/week Eat 3 meals day,  2-3 snacks a day Space meals 4-6 hours apart Limit desserts/sweets Complete 3 Day Food Record and bring to next appt Bring blood sugar records to the next appointment Carry fast acting glucose and a snack at all times Return for appointment on: Tues February 23, 2016 at 3:15 with Jaclyn Shaggy (dietitian)   Expected Outcomes:  Demonstrated interest in learning. Expect positive outcomes  Education material provided:  General Meal Planning Guidelines Simple Meal Plan 3 Day Food Record Glucose tablets Symptoms, causes and treatments of Hypoglycemia  If problems or questions, patient to contact team via:  Johny Drilling, [place], [place], CDE [phone]  Future DSME appointment: 4-6 wks  Since patient has been to diabetes classes in the past, she will return for the 2 Hour Refresher program. Her next appointment is Tuesday February 23, 2016 with a dietitian.

## 2016-01-26 NOTE — Patient Instructions (Addendum)
Check blood sugars 1 x day before breakfast or 2  hrs after supper every day Exercise: Begin walking for  10-15  minutes  3 days a week and gradually increase to 150 minutes/week Eat 3 meals day,  2-3 snacks a day Space meals 4-6 hours apart Limit desserts/sweets Complete 3 Day Food Record and bring to next appt Bring blood sugar records to the next appointment Carry fast acting glucose and a snack at all times Return for appointment on: Tues February 23, 2016 at 3:15 with Jaclyn Shaggy (dietitian)

## 2016-02-23 ENCOUNTER — Encounter: Payer: Self-pay | Admitting: Dietician

## 2016-02-23 ENCOUNTER — Encounter: Payer: 59 | Attending: Internal Medicine | Admitting: Dietician

## 2016-02-23 VITALS — BP 118/70 | Ht 64.0 in | Wt 167.1 lb

## 2016-02-23 DIAGNOSIS — Z794 Long term (current) use of insulin: Secondary | ICD-10-CM

## 2016-02-23 DIAGNOSIS — E119 Type 2 diabetes mellitus without complications: Secondary | ICD-10-CM

## 2016-02-23 NOTE — Progress Notes (Signed)
Diabetes Self-Management Education  Visit Type:  Follow-up  Appt. Start Time: 1520 Appt. End Time: 1620  02/23/2016  [name], identified by name and date of birth, is a 55 y.o. female with a diagnosis of Diabetes:  .   She came for the 2nd part of her diabetes refresher program. Her blood sugars continue to be in 190's to 200's. Her insulin was changed from Lantus to [facility] (lantus/GLP 1 combination). She reports she has experienced vaginal itching which she believes is a side effect of the medication and she has placed a call to her physician regarding this. Her food records indicate she is not eating excessive carbohydrate and is overall making some healthy food choices. Portion control is an issue at some meals. ASSESSMENT  Height 5\' 4"  (1.626 m), weight 167 lb 1.6 oz (75.796 kg). Body mass index is 28.67 kg/(m^2).  Blood Pressure: 118/70       Diabetes Self-Management Education - [id number]    Complications   Last HgB A1C per patient/outside source 9.1 %   How often do you check your blood sugar? 1-2 times/day   Fasting Blood glucose range (mg/dL) 180-200;>200   Have you had a dilated eye exam in the past 12 months? Yes   Have you had a dental exam in the past 12 months? Yes   Are you checking your feet? Yes   How many days per week are you checking your feet? 7   Dietary Intake   Breakfast yogurt and peanut butter and crackers during the week; eggs, bacon, ww toast, grits on weekends   Snack (morning) banana or cherries   Lunch ham, [place] and cheese sandwich   Snack (afternoon) peaches or other fruit   Dinner Ex. Salmon, rice, broccili or chicken pie, baked squash, broccoli or steak, salad, potatoes   Snack (evening) graham crackers    Exercise   Exercise Type Light (walking / raking leaves)   How many days per week to you exercise? 2   How many minutes per day do you exercise? 20   Total minutes per week of exercise 40   Patient Education   Nutrition  management  Role of diet in the treatment of diabetes and the relationship between the three main macronutrients and blood glucose level;Food label reading, portion sizes and measuring food.;Carbohydrate counting   Physical activity and exercise  Role of exercise on diabetes management, blood pressure control and cardiac health.   Post-Education Assessment   Patient understands the diabetes disease and treatment process. Demonstrates understanding / competency   Patient understands incorporating nutritional management into lifestyle. Demonstrates understanding / competency   Patient undertands incorporating physical activity into lifestyle. Demonstrates understanding / competency   Patient understands using medications safely. Demonstrates understanding / competency   Patient understands monitoring blood glucose, interpreting and using results Demonstrates understanding / competency   Patient understands prevention, detection, and treatment of acute complications. Demonstrates understanding / competency   Patient understands prevention, detection, and treatment of chronic complications. Demonstrates understanding / competency   Patient understands how to develop strategies to address psychosocial issues. Demonstrates understanding / competency   Patient understands how to develop strategies to promote health/change behavior. Demonstrates understanding / competency   Outcomes   Program Status Completed      Learning Objective:  Patient will have a greater understanding of diabetes self-management. Patient education plan is to attend individual and/or group sessions per assessed needs and concerns.   Plan:  Patient to balance meals  with 2-3 oz. protein, 2-3 carbohydrate servings spreading 9 servings of carbohydrate over 3 meals and 1-2 snacks. To add as many non-starchy vegetables as possible to help increase fiber and to increase satiety with meals. To read labels for carbohydrate grams, with  range of 30-45 grams per meal.   Expected Outcomes:  Demonstrated interest in learning. Expect positive outcomes  Education material provided: Planning a Balanced Meal, Nutrition Prescription If problems or questions, patient to contact team via:  [place]  [phone] Future DSME appointment: - PRN

## 2016-02-23 NOTE — Progress Notes (Deleted)
Diabetes Self-Management Education  Visit Type:  2nd part of 2-hour refresher program  Appt. Start Time: 1520 Appt. End Time: 1620  02/23/2016  [name], identified by name and date of birth, is a 55 y.o. female with a diagnosis of Diabetes:  .   ASSESSMENT  Height 5\' 4"  (1.626 m), weight 167 lb 1.6 oz (75.796 kg). Body mass index is 28.67 kg/(m^2).     Learning Objective:  Patient will have a greater understanding of diabetes self-management. Patient education plan is to attend individual and/or group sessions per assessed needs and concerns.   Plan:   There are no Patient Instructions on file for this visit.   Expected Outcomes:     Education material provided: {CHL AMB DSME EDUCATION MATERIAL:22611}  If problems or questions, patient to contact team via:  {TYPE OF CONTACT:20355}  Future DSME appointment: -

## 2016-02-23 NOTE — Patient Instructions (Signed)
Patient to balance meals with 2-3 oz. protein, 2-3 carbohydrate servings spreading 9 servings of carbohydrate over 3 meals and 1-2 snacks. To add as many non-starchy vegetables as possible to help increase fiber and to increase satiety with meals. To read labels for carbohydrate grams, with range of 30-45 grams per meal.

## 2016-03-02 ENCOUNTER — Encounter: Payer: Self-pay | Admitting: Family Medicine

## 2016-03-02 ENCOUNTER — Ambulatory Visit (INDEPENDENT_AMBULATORY_CARE_PROVIDER_SITE_OTHER): Payer: 59 | Admitting: Family Medicine

## 2016-03-02 VITALS — BP 112/80 | HR 94 | Temp 98.7°F | Resp 18 | Ht 64.0 in | Wt 169.1 lb

## 2016-03-02 DIAGNOSIS — E1165 Type 2 diabetes mellitus with hyperglycemia: Secondary | ICD-10-CM

## 2016-03-02 DIAGNOSIS — K760 Fatty (change of) liver, not elsewhere classified: Secondary | ICD-10-CM

## 2016-03-02 DIAGNOSIS — I700 Atherosclerosis of aorta: Secondary | ICD-10-CM | POA: Diagnosis not present

## 2016-03-02 DIAGNOSIS — F411 Generalized anxiety disorder: Secondary | ICD-10-CM

## 2016-03-02 DIAGNOSIS — E538 Deficiency of other specified B group vitamins: Secondary | ICD-10-CM

## 2016-03-02 DIAGNOSIS — E1140 Type 2 diabetes mellitus with diabetic neuropathy, unspecified: Secondary | ICD-10-CM

## 2016-03-02 DIAGNOSIS — E785 Hyperlipidemia, unspecified: Secondary | ICD-10-CM | POA: Diagnosis not present

## 2016-03-02 DIAGNOSIS — IMO0002 Reserved for concepts with insufficient information to code with codable children: Secondary | ICD-10-CM

## 2016-03-02 MED ORDER — METFORMIN HCL ER (OSM) 1000 MG PO TB24: 1000.0000 mg | ORAL_TABLET | Freq: Every evening | ORAL | Status: DC

## 2016-03-02 NOTE — Progress Notes (Signed)
Name: Brandi Jordan   MRN: [id number]    DOB: June 15, 1961   Date:03/02/2016       Progress Note  Subjective  Chief Complaint  Chief Complaint  Patient presents with  . Hyperlipidemia    pt here for 3 month follow up  . Gastroesophageal Reflux    HPI  DMII: hgbA1C was 8.5% 3 months ago and went up to 9.1% she is now seeing Dr. Nira Retort  She is taking Metformin ER once daily and Soliqua currently on 23 units daily, tolerating it well.. She states she is has been more compliant with a diabetic diet, seen by dietician. FSBS still elevated in the 200's fasting. She denies polydipsia, polyuria or polyphagia.   Hyperlipidemia:she has not been taking Atorvastatin daily and she is not sure why. She is willing to re-start it  Gastritis: she has seen Dr. Durwin Reges, now on Protonix, and probiotic  nausea has resolved,  No longer has pain and diarrhea is now sporadic.  Atherosclerosis of Aorta: discussed importance of statin therapy and aspirin, she is still not taking Lipitor   Anxiety: she is taking alprazolam prn, she states she is doing okay, she does not want to take daily medication at this time. She states very seldom has panic attacks, she wakes up during the night secondary to anxiety  Patient Active Problem List   Diagnosis Date Noted  . Fatty liver 05/20/2015  . Calcification of aorta (HCC) 05/20/2015  . Recurrent vaginitis 05/20/2015  . Hiatal hernia 05/20/2015  . Trigger finger 05/11/2015  . Internal hemorrhoids without complication 123XX123  . Type 2 diabetes, uncontrolled, with neuropathy ([place]) 03/05/2015  . Anxiety, generalized 03/05/2015  . B12 deficiency 03/05/2015  . Hyperlipidemia 03/05/2015  . Gastritis   . Benign neoplasm of ascending colon     Past Surgical History  Procedure Laterality Date  . Colonoscopy  09/01/2010    Iftikhar-diverticulosis only  . Partial hysterectomy  1990    Partial  . Abdominal hysterectomy    . Colonoscopy with propofol N/A 03/03/2015   Procedure: COLONOSCOPY WITH PROPOFOL;  Surgeon: Lucilla Lame, MD;  Location: [facility] ENDOSCOPY;  Service: Endoscopy;  Laterality: N/A;  . Esophagogastroduodenoscopy (egd) with propofol N/A 03/03/2015    Procedure: ESOPHAGOGASTRODUODENOSCOPY (EGD) WITH PROPOFOL;  Surgeon: Lucilla Lame, MD;  Location: [facility] ENDOSCOPY;  Service: Endoscopy;  Laterality: N/A;  . Breast biopsy Right     rt cyst removed    Family History  Problem Relation Age of Onset  . Cholecystitis Mother   . Kidney failure Mother   . Hypothyroidism Mother   . Diabetes Mother   . Cancer Father     lung  . Leukemia Sister     chronic lymphocytic  . Diabetes Sister   . Colon cancer Maternal Aunt 73  . Liver disease Neg Hx   . Hypothyroidism Daughter   . Cancer Maternal Aunt     Breast  . Breast cancer Maternal Aunt   . Diabetes Brother     Social History   Social History  . Marital Status: Married    Spouse Name: N/A  . Number of Children: 1  . Years of Education: N/A   Occupational History  . 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 Smoking status: Former Smoker -- 1.00 packs/day for 35 years    Types: Cigarettes    Quit date: 06/03/2010  . Smokeless tobacco: Never Used  . Alcohol Use: No  . Drug Use: No  . Sexual  Activity: Not Currently   Other Topics Concern  . Not on file   Social History Narrative   Lives with husband, 1 healthy grown daughter, works in Therapist, art at [employer] outpatient prescriptions:  .  ALPRAZolam (XANAX) 0.5 MG tablet, Take 1 tablet (0.5 mg total) by mouth daily as needed., Disp: 90 tablet, Rfl: 0 .  aspirin 81 MG tablet, Take 81 mg by mouth daily. Reported on 09/01/2015, Disp: , Rfl:  .  atorvastatin (LIPITOR) 40 MG tablet, Take 1 tablet (40 mg total) by mouth daily., Disp: 90 tablet, Rfl: 1 .  pantoprazole (PROTONIX) 40 MG tablet, Take 1 tablet by mouth  daily, Disp: 90 tablet, Rfl: 1 .  Probiotic Product (VSL#3) CAPS, Take 2 capsules by mouth  daily., Disp: , Rfl:  .  SOLIQUA 100-33 UNT-MCG/ML SOPN, INJECT 23 UNITS SUBCUTANEOUSLY ONCE DAILY., Disp: , Rfl: 6 .  metformin (FORTAMET) 1000 MG (OSM) 24 hr tablet, Take 1 tablet (1,000 mg total) by mouth every evening., Disp: 90 tablet, Rfl: 0 .  ONETOUCH VERIO test strip, Reported on 03/02/2016, Disp: , Rfl:   Allergies  Allergen Reactions  . Bydureon [Exenatide] Nausea And Vomiting  . Effexor Xr [Venlafaxine Hcl Er] Nausea And Vomiting and Other (See Comments)    "felt weird"  . Wilder Glade [Dapagliflozin] Other (See Comments)    headache  . Invokana [Canagliflozin] Other (See Comments)    vaginitis  . Kombiglyze [Saxagliptin-Metformin Er] Diarrhea and Other (See Comments)    dizziness  . Other Other (See Comments) and Nausea Only    Diabetic Medications  . Amoxicillin Rash     ROS  Constitutional: Negative for fever or weight change.  Respiratory: Negative for cough and shortness of breath.   Cardiovascular: Negative for chest pain or palpitations.  Gastrointestinal: Negative for abdominal pain, no bowel changes.  Musculoskeletal: Negative for gait problem or joint swelling.  Skin: Negative for rash.  Neurological: Negative for dizziness or headache.  No other specific complaints in a complete review of systems (except as listed in HPI above).  Objective  Filed Vitals:   03/02/16 1534  BP: 112/80  Pulse: 94  Temp: 98.7 F (37.1 C)  Resp: 18  Height: 5\' 4"  (1.626 m)  Weight: 169 lb 1 oz (76.686 kg)  SpO2: 96%    Body mass index is 29.01 kg/(m^2).  Physical Exam  Constitutional: Patient appears well-developed and well-nourished. Obese  No distress.  HEENT: head atraumatic, normocephalic, pupils equal and reactive to light, neck supple, throat within normal limits Cardiovascular: Normal rate, regular rhythm and normal heart sounds.  No murmur heard. No BLE edema. Pulmonary/Chest: Effort normal and breath sounds normal. No respiratory distress. Abdominal: Soft.   There is no tenderness. Psychiatric: Patient has a normal mood and affect. behavior is normal. Judgment and thought content normal.  PHQ2/9: Depression screen [facility] 2/9 03/02/2016 01/26/2016 11/30/2015 09/01/2015 08/26/2015  Decreased Interest 0 0 0 0 0  Down, Depressed, Hopeless 0 0 0 0 0  PHQ - 2 Score 0 0 0 0 0     Fall Risk: Fall Risk  03/02/2016 02/23/2016 01/26/2016 11/30/2015 09/01/2015  Falls in the past year? No (No Data) No No No     Assessment & Plan  1. Type 2 diabetes, uncontrolled, with neuropathy (HCC)  - metformin (FORTAMET) 1000 MG (OSM) 24 hr tablet; Take 1 tablet (1,000 mg total) by mouth every evening.  Dispense: 90 tablet; Refill: 0  2. Anxiety, generalized  Continue  prn medication   3. Hyperlipidemia  Resume statin therapy   4. Calcification of aorta (HCC)  Needs to take aspirin daily and statin therapy   5. Fatty liver  Seen by Dr. Durwin Reges   6. B12 deficiency  B12 today injection

## 2016-04-26 ENCOUNTER — Other Ambulatory Visit: Payer: Self-pay | Admitting: Family Medicine

## 2016-04-26 DIAGNOSIS — Z1231 Encounter for screening mammogram for malignant neoplasm of breast: Secondary | ICD-10-CM

## 2016-05-18 ENCOUNTER — Other Ambulatory Visit: Payer: Self-pay | Admitting: Family Medicine

## 2016-05-19 ENCOUNTER — Other Ambulatory Visit: Payer: Self-pay

## 2016-05-19 MED ORDER — ONETOUCH VERIO VI STRP: ORAL_STRIP | 2 refills | Status: DC

## 2016-05-19 NOTE — Telephone Encounter (Signed)
Patient requesting refill of One Touch Strips.

## 2016-05-27 ENCOUNTER — Ambulatory Visit
Admission: RE | Admit: 2016-05-27 | Discharge: 2016-05-27 | Disposition: A | Discharge status: Home / Self Care | Payer: 59 | Source: Ambulatory Visit | Attending: Family Medicine | Admitting: Family Medicine

## 2016-05-27 DIAGNOSIS — Z1231 Encounter for screening mammogram for malignant neoplasm of breast: Secondary | ICD-10-CM | POA: Diagnosis not present

## 2016-06-03 ENCOUNTER — Ambulatory Visit: Payer: 59 | Admitting: Family Medicine

## 2016-06-10 ENCOUNTER — Other Ambulatory Visit: Payer: Self-pay | Admitting: Family Medicine

## 2016-06-10 DIAGNOSIS — IMO0002 Reserved for concepts with insufficient information to code with codable children: Secondary | ICD-10-CM

## 2016-06-10 DIAGNOSIS — E1140 Type 2 diabetes mellitus with diabetic neuropathy, unspecified: Secondary | ICD-10-CM

## 2016-06-10 DIAGNOSIS — E1165 Type 2 diabetes mellitus with hyperglycemia: Principal | ICD-10-CM

## 2016-06-10 DIAGNOSIS — B3731 Acute candidiasis of vulva and vagina: Secondary | ICD-10-CM

## 2016-06-10 DIAGNOSIS — B373 Candidiasis of vulva and vagina: Secondary | ICD-10-CM

## 2016-06-21 ENCOUNTER — Ambulatory Visit: Payer: 59 | Admitting: Family Medicine

## 2016-06-22 ENCOUNTER — Encounter: Payer: Self-pay | Admitting: Family Medicine

## 2016-06-22 ENCOUNTER — Ambulatory Visit (INDEPENDENT_AMBULATORY_CARE_PROVIDER_SITE_OTHER): Payer: 59 | Admitting: Family Medicine

## 2016-06-22 VITALS — BP 112/68 | HR 102 | Temp 98.1°F | Resp 17 | Ht 64.0 in | Wt 169.0 lb

## 2016-06-22 DIAGNOSIS — K760 Fatty (change of) liver, not elsewhere classified: Secondary | ICD-10-CM | POA: Diagnosis not present

## 2016-06-22 DIAGNOSIS — E785 Hyperlipidemia, unspecified: Secondary | ICD-10-CM

## 2016-06-22 DIAGNOSIS — Z23 Encounter for immunization: Secondary | ICD-10-CM

## 2016-06-22 DIAGNOSIS — E1140 Type 2 diabetes mellitus with diabetic neuropathy, unspecified: Secondary | ICD-10-CM | POA: Diagnosis not present

## 2016-06-22 DIAGNOSIS — E782 Mixed hyperlipidemia: Secondary | ICD-10-CM | POA: Diagnosis not present

## 2016-06-22 DIAGNOSIS — E1169 Type 2 diabetes mellitus with other specified complication: Secondary | ICD-10-CM

## 2016-06-22 DIAGNOSIS — E1165 Type 2 diabetes mellitus with hyperglycemia: Secondary | ICD-10-CM

## 2016-06-22 DIAGNOSIS — IMO0002 Reserved for concepts with insufficient information to code with codable children: Secondary | ICD-10-CM

## 2016-06-22 DIAGNOSIS — E538 Deficiency of other specified B group vitamins: Secondary | ICD-10-CM

## 2016-06-22 DIAGNOSIS — I700 Atherosclerosis of aorta: Secondary | ICD-10-CM | POA: Diagnosis not present

## 2016-06-22 DIAGNOSIS — F411 Generalized anxiety disorder: Secondary | ICD-10-CM

## 2016-06-22 MED ORDER — B-12 1000 MCG SL SUBL: 1.0000 | SUBLINGUAL_TABLET | Freq: Every day | SUBLINGUAL | 0 refills | Status: DC

## 2016-06-22 MED ORDER — CYANOCOBALAMIN 1000 MCG/ML IJ SOLN
1000.0000 ug | Freq: Once | INTRAMUSCULAR | Status: AC
  Administered 2016-06-22: 1000 ug via INTRAMUSCULAR

## 2016-06-22 MED ORDER — NATEGLINIDE 60 MG PO TABS: 60.0000 mg | ORAL_TABLET | Freq: Three times a day (TID) | ORAL | 0 refills | Status: DC

## 2016-06-22 MED ORDER — ALPRAZOLAM 0.5 MG PO TABS: 0.5000 mg | ORAL_TABLET | Freq: Every day | ORAL | 0 refills | Status: DC | PRN

## 2016-06-22 NOTE — Progress Notes (Signed)
Name: Brandi Jordan   MRN: [id number]    DOB: 08-15-1961   Date:06/22/2016       Progress Note  Subjective  Chief Complaint  Chief Complaint  Patient presents with  . Follow-up    3 mo  . Medication Refill    Xanax     HPI  DMII: hgbA1C was 8.5% 3 months ago and went up to 9.1% she is now seeing Dr. Nira Retort, last hgbA1C was done 06/07/2016 and it was 8.7%. She is on Lantus 40 units daily, Metformin ER 1000mg  daily once daily and was supposed to be taking Novolog 10 units prior to each meal, but she states it makes her feel angry and had a severe hypoglycemic episode that made her feel scared.. She states she still eats late and likes to snack,  seen by dietician. FSBS still elevated in the 200's fasting. She has polydipsia but denies polyphagia or polyuria. She still has recurrent yeast vaginitis, she still has neuropathy on both feet - burning sensation ( discussed gabapentin or Lyrica ) but she wants to hold off for now. Dyslipidemia. Triglycerides improved ( done by [facility] - dropped from 403 to 302 ) still not at goal, also has low HDL  Hyperlipidemia: she has not been taking Atorvastatin, needs to resume it   Gastritis: she has seen Dr. Durwin Reges, now on Protonix, and probiotic  nausea still present intermittently, but not like it was.  No longer has pain and diarrhea is now sporadic. Discussed long term risk of long term PPI use.  Atherosclerosis of Aorta: discussed importance of statin therapy and aspirin  Anxiety: she is taking alprazolam prn, she states she is doing okay, she does not want to take daily medication at this time. She states very seldom has panic attacks, she wakes up during the night secondary to anxiety, 90 pills has lasted over 6 months   Patient Active Problem List   Diagnosis Date Noted  . Fatty liver 05/20/2015  . Calcification of aorta (HCC) 05/20/2015  . Recurrent vaginitis 05/20/2015  . Hiatal hernia 05/20/2015  . Trigger finger 05/11/2015  . Internal  hemorrhoids without complication 123XX123  . Type 2 diabetes, uncontrolled, with neuropathy ([place]) 03/05/2015  . Anxiety, generalized 03/05/2015  . B12 deficiency 03/05/2015  . Hyperlipidemia 03/05/2015  . Gastritis   . Benign neoplasm of ascending colon     Past Surgical History:  Procedure Laterality Date  . ABDOMINAL HYSTERECTOMY    . BREAST BIOPSY Right    rt cyst removed  . COLONOSCOPY  09/01/2010   Iftikhar-diverticulosis only  . COLONOSCOPY WITH PROPOFOL N/A 03/03/2015   Procedure: COLONOSCOPY WITH PROPOFOL;  Surgeon: Lucilla Lame, MD;  Location: [facility] ENDOSCOPY;  Service: Endoscopy;  Laterality: N/A;  . ESOPHAGOGASTRODUODENOSCOPY (EGD) WITH PROPOFOL N/A 03/03/2015   Procedure: ESOPHAGOGASTRODUODENOSCOPY (EGD) WITH PROPOFOL;  Surgeon: Lucilla Lame, MD;  Location: [facility] ENDOSCOPY;  Service: Endoscopy;  Laterality: N/A;  . PARTIAL HYSTERECTOMY  1990   Partial    Family History  Problem Relation Age of Onset  . Cholecystitis Mother   . Kidney failure Mother   . Hypothyroidism Mother   . Diabetes Mother   . Cancer Father     lung  . Leukemia Sister     chronic lymphocytic  . Diabetes Sister   . Colon cancer Maternal Aunt 4  . Hypothyroidism Daughter   . Cancer Maternal Aunt     Breast  . Breast cancer Maternal Aunt   . Diabetes Brother   .  Cancer Sister 66    breast cancer, also has leukemia  . Liver disease Neg Hx     Social History   Social History  . Marital status: Married    Spouse name: N/A  . Number of children: 1  . 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 Smoking status: Former Smoker    Packs/day: 1.00    Years: 35.00    Types: Cigarettes    Quit date: 06/03/2010  . Smokeless tobacco: Never Used  . Alcohol use No  . Drug use: No  . Sexual activity: Not Currently   Other Topics Concern  . Not on file   Social History Narrative   Lives with husband, 1 healthy grown daughter,  works in Therapist, art at [employer] Prescriptions:  .  ALPRAZolam (XANAX) 0.5 MG tablet, Take 1 tablet (0.5 mg total) by mouth daily as needed., Disp: 90 tablet, Rfl: 0 .  aspirin 81 MG tablet, Take 81 mg by mouth daily. Reported on 09/01/2015, Disp: , Rfl:  .  atorvastatin (LIPITOR) 40 MG tablet, Take 1 tablet (40 mg total) by mouth daily., Disp: 90 tablet, Rfl: 1 .  BD PEN NEEDLE NANO U/F 32G X 4 MM MISC, Use 1 needle daily with  Levemir, Disp: 90 each, Rfl: 1 .  fluconazole (DIFLUCAN) 150 MG tablet, TAKE 1 TABLET BY MOUTH ONCE A WEEK, Disp: 11 tablet, Rfl: 0 .  metformin (FORTAMET) 1000 MG (OSM) 24 hr tablet, TAKE 2 TABLETS BY MOUTH  EVERY EVENING, Disp: 180 tablet, Rfl: 0 .  ONETOUCH VERIO test strip, Reported on 03/02/2016, Disp: 100 each, Rfl: 2 .  pantoprazole (PROTONIX) 40 MG tablet, TAKE 1 TABLET BY MOUTH  DAILY, Disp: 90 tablet, Rfl: 0 .  Probiotic Product (VSL#3) CAPS, Take 2 capsules by mouth daily., Disp: , Rfl:  .  Cyanocobalamin (B-12) 1000 MCG SUBL, Place 1 tablet under the tongue daily., Disp: 30 each, Rfl: 0 .  LANTUS SOLOSTAR 100 UNIT/ML Solostar Pen, Inject 40 Units into the skin daily., Disp: , Rfl:  .  nateglinide (STARLIX) 60 MG tablet, Take 1 tablet (60 mg total) by mouth 3 (three) times daily with meals., Disp: 90 tablet, Rfl: 0  Current Facility-Administered Medications:  .  cyanocobalamin ((VITAMIN B-12)) injection 1,000 mcg, 1,000 mcg, Intramuscular, Once, Steele Sizer, MD  Allergies  Allergen Reactions  . Bydureon [Exenatide] Nausea And Vomiting  . Effexor Xr [Venlafaxine Hcl Er] Nausea And Vomiting and Other (See Comments)    "felt weird"  . Wilder Glade [Dapagliflozin] Other (See Comments)    headache  . Invokana [Canagliflozin] Other (See Comments)    vaginitis  . Kombiglyze [Saxagliptin-Metformin Er] Diarrhea and Other (See Comments)    dizziness  . Other Other (See Comments) and Nausea Only    Diabetic Medications  . Amoxicillin  Rash     ROS  Constitutional: Negative for fever or weight change.  Respiratory: Negative for cough and shortness of breath.   Cardiovascular: Negative for chest pain or palpitations.  Gastrointestinal: Negative for abdominal pain, no bowel changes.  Musculoskeletal: Negative for gait problem or joint swelling.  Skin: Negative for rash.  Neurological: Negative for dizziness or headache.  No other specific complaints in a complete review of systems (except as listed in HPI above).  Objective  Vitals:   06/22/16 0744  BP: 112/68  Pulse: (!) 102  Resp: 17  Temp:  98.1 F (36.7 C)  TempSrc: Oral  SpO2: 98%  Weight: 169 lb (76.7 kg)  Height: 5\' 4"  (1.626 m)    Body mass index is 29.01 kg/m.  Physical Exam  Constitutional: Patient appears well-developed and well-nourished. Obese No distress.  HEENT: head atraumatic, normocephalic, pupils equal and reactive to light, neck supple, throat within normal limits Cardiovascular: Normal rate, regular rhythm and normal heart sounds.  No murmur heard. No BLE edema. Pulmonary/Chest: Effort normal and breath sounds normal. No respiratory distress. Abdominal: Soft.  There is no tenderness. Psychiatric: Patient has a normal mood and affect. behavior is normal. Judgment and thought content normal.  Diabetic Foot Exam: Diabetic Foot Exam - Simple   Simple Foot Form Diabetic Foot exam was performed with the following findings:  Yes 06/22/2016  8:23 AM  Visual Inspection No deformities, no ulcerations, no other skin breakdown bilaterally:  Yes Sensation Testing Intact to touch and monofilament testing bilaterally:  Yes Pulse Check Posterior Tibialis and Dorsalis pulse intact bilaterally:  Yes Comments      PHQ2/9: Depression screen [facility] 2/9 06/22/2016 03/02/2016 01/26/2016 11/30/2015 09/01/2015  Decreased Interest 0 0 0 0 0  Down, Depressed, Hopeless 0 0 0 0 0  PHQ - 2 Score 0 0 0 0 0     Fall Risk: Fall Risk  06/22/2016 03/02/2016  02/23/2016 01/26/2016 11/30/2015  Falls in the past year? No No (No Data) No No     Functional Status Survey: Is the patient deaf or have difficulty hearing?: Yes (sometimes) Does the patient have difficulty seeing, even when wearing glasses/contacts?: Yes (glasses) Does the patient have difficulty concentrating, remembering, or making decisions?: No Does the patient have difficulty walking or climbing stairs?: No Does the patient have difficulty dressing or bathing?: No Does the patient have difficulty doing errands alone such as visiting a doctor's office or shopping?: No    Assessment & Plan  1. Type 2 diabetes, uncontrolled, with neuropathy ([place])  We will try Starlix   2. Anxiety, generalized   - ALPRAZolam (XANAX) 0.5 MG tablet; Take 1 tablet (0.5 mg total) by mouth daily as needed.  Dispense: 90 tablet; Refill: 0  3. Mixed hyperlipidemia  Needs to follow life style modification and also needs to change diet  4. Calcification of aorta (HCC)  Needs to resume Atorvastatin   5. Fatty liver  Needs to get DM under control   6. B12 deficiency  - cyanocobalamin ((VITAMIN B-12)) injection 1,000 mcg; Inject 1 mL (1,000 mcg total) into the muscle once. - Cyanocobalamin (B-12) 1000 MCG SUBL; Place 1 tablet under the tongue daily.  Dispense: 30 each; Refill: 0  7. Needs flu shot  - Flu Vaccine QUAD 36+ mos IM  8. Dyslipidemia associated with type 2 diabetes mellitus ([place])

## 2016-06-28 ENCOUNTER — Ambulatory Visit: Payer: 59 | Admitting: Family Medicine

## 2016-07-29 ENCOUNTER — Other Ambulatory Visit: Payer: Self-pay | Admitting: Family Medicine

## 2016-07-29 NOTE — Telephone Encounter (Signed)
Patient requesting refill of OneTouch test strips to [facility].

## 2016-08-17 ENCOUNTER — Other Ambulatory Visit: Payer: Self-pay | Admitting: Family Medicine

## 2016-08-17 NOTE — Telephone Encounter (Signed)
Patient requesting refill of Needles to [facility].  

## 2016-08-18 ENCOUNTER — Other Ambulatory Visit: Payer: Self-pay

## 2016-08-18 DIAGNOSIS — E1165 Type 2 diabetes mellitus with hyperglycemia: Principal | ICD-10-CM

## 2016-08-18 DIAGNOSIS — B3731 Acute candidiasis of vulva and vagina: Secondary | ICD-10-CM

## 2016-08-18 DIAGNOSIS — B373 Candidiasis of vulva and vagina: Secondary | ICD-10-CM

## 2016-08-18 DIAGNOSIS — IMO0002 Reserved for concepts with insufficient information to code with codable children: Secondary | ICD-10-CM

## 2016-08-18 DIAGNOSIS — E1140 Type 2 diabetes mellitus with diabetic neuropathy, unspecified: Secondary | ICD-10-CM

## 2016-08-18 NOTE — Telephone Encounter (Signed)
Patient requesting refill of Metformin, Pantoprazole and Fluconazole to [facility].

## 2016-08-19 MED ORDER — FLUCONAZOLE 150 MG PO TABS: 150.0000 mg | ORAL_TABLET | ORAL | 0 refills | Status: DC

## 2016-08-19 MED ORDER — METFORMIN HCL ER (OSM) 1000 MG PO TB24: 2000.0000 mg | ORAL_TABLET | Freq: Every evening | ORAL | 0 refills | Status: DC

## 2016-08-19 MED ORDER — PANTOPRAZOLE SODIUM 40 MG PO TBEC: 40.0000 mg | DELAYED_RELEASE_TABLET | Freq: Every day | ORAL | 0 refills | Status: DC

## 2016-09-23 ENCOUNTER — Ambulatory Visit (INDEPENDENT_AMBULATORY_CARE_PROVIDER_SITE_OTHER): Payer: 59 | Admitting: Family Medicine

## 2016-09-23 ENCOUNTER — Encounter: Payer: Self-pay | Admitting: Family Medicine

## 2016-09-23 VITALS — BP 136/68 | HR 108 | Temp 99.0°F | Resp 18 | Ht 64.0 in | Wt 168.9 lb

## 2016-09-23 DIAGNOSIS — F411 Generalized anxiety disorder: Secondary | ICD-10-CM | POA: Diagnosis not present

## 2016-09-23 DIAGNOSIS — Z01419 Encounter for gynecological examination (general) (routine) without abnormal findings: Secondary | ICD-10-CM

## 2016-09-23 DIAGNOSIS — IMO0002 Reserved for concepts with insufficient information to code with codable children: Secondary | ICD-10-CM

## 2016-09-23 DIAGNOSIS — I700 Atherosclerosis of aorta: Secondary | ICD-10-CM

## 2016-09-23 DIAGNOSIS — E1140 Type 2 diabetes mellitus with diabetic neuropathy, unspecified: Secondary | ICD-10-CM | POA: Diagnosis not present

## 2016-09-23 DIAGNOSIS — R350 Frequency of micturition: Secondary | ICD-10-CM

## 2016-09-23 DIAGNOSIS — E538 Deficiency of other specified B group vitamins: Secondary | ICD-10-CM

## 2016-09-23 DIAGNOSIS — E1165 Type 2 diabetes mellitus with hyperglycemia: Secondary | ICD-10-CM

## 2016-09-23 DIAGNOSIS — E782 Mixed hyperlipidemia: Secondary | ICD-10-CM | POA: Diagnosis not present

## 2016-09-23 DIAGNOSIS — N898 Other specified noninflammatory disorders of vagina: Secondary | ICD-10-CM

## 2016-09-23 LAB — POCT URINALYSIS DIPSTICK
BILIRUBIN UA: NEGATIVE
GLUCOSE: 2000
Ketones, UA: NEGATIVE
Leukocytes, UA: NEGATIVE
Nitrite, UA: NEGATIVE
Protein, UA: NEGATIVE
RBC UA: NEGATIVE
SPEC GRAV UA: 1.01
Urobilinogen, UA: NEGATIVE
pH, UA: 5

## 2016-09-23 MED ORDER — NATEGLINIDE 60 MG PO TABS: 60.0000 mg | ORAL_TABLET | Freq: Three times a day (TID) | ORAL | 0 refills | Status: DC

## 2016-09-23 MED ORDER — CYANOCOBALAMIN 1000 MCG/ML IJ SOLN
1000.0000 ug | Freq: Once | INTRAMUSCULAR | Status: AC
  Administered 2016-09-23: 1000 ug via INTRAMUSCULAR

## 2016-09-23 MED ORDER — ALPRAZOLAM 0.5 MG PO TABS: 0.5000 mg | ORAL_TABLET | Freq: Every day | ORAL | 0 refills | Status: DC | PRN

## 2016-09-23 MED ORDER — OMEGA-3-ACID ETHYL ESTERS 1 G PO CAPS: 2.0000 g | ORAL_CAPSULE | Freq: Two times a day (BID) | ORAL | 1 refills | Status: DC

## 2016-09-23 MED ORDER — METFORMIN HCL ER 750 MG PO TB24: 1500.0000 mg | ORAL_TABLET | Freq: Two times a day (BID) | ORAL | 0 refills | Status: DC

## 2016-09-23 MED ORDER — DULOXETINE HCL 30 MG PO CPEP: 30.0000 mg | ORAL_CAPSULE | Freq: Every day | ORAL | 0 refills | Status: DC

## 2016-09-23 NOTE — Progress Notes (Signed)
Name: Brandi Jordan   MRN: [id number]    DOB: 20-May-1961   Date:09/23/2016       Progress Note  Subjective  Chief Complaint  Chief Complaint  Patient presents with  . Annual Exam    HPI  Well Woman: she has not been sexually active for the few years, she has noticed vaginal discharge since last year, symptoms are intermittent, no pruritus, white in color - notices it in her underware. She has also noticed mild back pain. No dysuria but has noticed mild increase in urinary frequency  DMII: hgbA1C was 8.7% when she saw endocrinologist back in October 2017. She states no longer seeing Dr. Nira Retort and would like to have labs done She is on Lantus 40 units daily, but stopped Metformin ER 1000mg  because she received new pills from [place] and it caused her to have upset stomach.  She is not taking pre-meal insulin and did not start starlix as recommended on her last visit.  She states she still eats late and likes to snack,  seen by dietician. FSBS still elevated in the 200's fasting. She has polydipsia but denies polyphagia but has polyuria. She still has recurrent vaginal discharge that is not itchy, she still has neuropathy on both feet - burning sensation ( discussed gabapentin or Lyrica ) but she wants to hold off for now. Dyslipidemia. Triglycerides improved ( done by [facility] - dropped from 403 to 302 ) still not at goal, also has low HDL, discussed adding Lovaza  Hyperlipidemia: she has not been taking Atorvastatin, needs to resume it and we will add Lovaza  Gastritis: she has seen Dr. Durwin Reges, now on Protonix, and probiotic nausea still present intermittently, but not like it was. No longer has pain and diarrhea is now sporadic. Discussed long term risk of long term PPI use.  Atherosclerosis of Aorta: discussed importance of statin therapy and aspirin  Anxiety: she is taking alprazolam prn, she states she is doing okay. She states very seldom has panic attacks, she wakes up during the night  secondary to anxiety, discussed mindfulness, weaning off alprazolam and trying Cymbalta that should help with GI symptoms    Patient Active Problem List   Diagnosis Date Noted  . Fatty liver 05/20/2015  . Calcification of aorta (HCC) 05/20/2015  . Recurrent vaginitis 05/20/2015  . Hiatal hernia 05/20/2015  . Trigger finger 05/11/2015  . Internal hemorrhoids without complication [id number]  . Type 2 diabetes, uncontrolled, with neuropathy ([place]) 03/05/2015  . Anxiety, generalized 03/05/2015  . B12 deficiency 03/05/2015  . Hyperlipidemia 03/05/2015  . Gastritis   . Benign neoplasm of ascending colon     Past Surgical History:  Procedure Laterality Date  . ABDOMINAL HYSTERECTOMY    . BREAST BIOPSY Right    rt cyst removed  . COLONOSCOPY  09/01/2010   Iftikhar-diverticulosis only  . COLONOSCOPY WITH PROPOFOL N/A 03/03/2015   Procedure: COLONOSCOPY WITH PROPOFOL;  Surgeon: Lucilla Lame, MD;  Location: [facility] ENDOSCOPY;  Service: Endoscopy;  Laterality: N/A;  . ESOPHAGOGASTRODUODENOSCOPY (EGD) WITH PROPOFOL N/A 03/03/2015   Procedure: ESOPHAGOGASTRODUODENOSCOPY (EGD) WITH PROPOFOL;  Surgeon: Lucilla Lame, MD;  Location: [facility] ENDOSCOPY;  Service: Endoscopy;  Laterality: N/A;  . PARTIAL HYSTERECTOMY  1990   Partial    Family History  Problem Relation Age of Onset  . Cholecystitis Mother   . Kidney failure Mother   . Hypothyroidism Mother   . Diabetes Mother   . Lung cancer Father   . Leukemia Sister  chronic lymphocytic  . Diabetes Sister   . Colon cancer Maternal Aunt 44  . Hypothyroidism Daughter   . Breast cancer Maternal Aunt   . Diabetes Brother   . Cancer Sister 64    breast cancer, also has leukemia  . Liver disease Neg Hx     Social History   Social History  . Marital status: Married    Spouse name: N/A  . Number of children: 1  . Years of education: N/A   Occupational History  . 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 Smoking status: Former  Smoker    Packs/day: 1.00    Years: 35.00    Types: Cigarettes    Start date: 08/29/1974    Quit date: 06/03/2010  . Smokeless tobacco: Never Used  . Alcohol use No  . Drug use: No  . Sexual activity: Not Currently   Other Topics Concern  . Not on file   Social History Narrative   Lives with husband, 1 healthy grown daughter, works in Therapist, art at [employer] Prescriptions:  .  ALPRAZolam (XANAX) 0.5 MG tablet, Take 1 tablet (0.5 mg total) by mouth daily as needed., Disp: 90 tablet, Rfl: 0 .  aspirin 81 MG tablet, Take 81 mg by mouth daily. Reported on 09/01/2015, Disp: , Rfl:  .  BD PEN NEEDLE NANO U/F 32G X 4 MM MISC, USE ONCE DAILY, Disp: 90 each, Rfl: 1 .  fluconazole (DIFLUCAN) 150 MG tablet, Take 1 tablet (150 mg total) by mouth once a week., Disp: 11 tablet, Rfl: 0 .  LANTUS SOLOSTAR 100 UNIT/ML Solostar Pen, Inject 40 Units into the skin daily., Disp: , Rfl:  .  ONETOUCH VERIO test strip, CHECK FINGER-STICK BLOOD  SUGAR TWICE DAILY, Disp: 100 each, Rfl: 1 .  pantoprazole (PROTONIX) 40 MG tablet, Take 1 tablet (40 mg total) by mouth daily., Disp: 90 tablet, Rfl: 0 .  Probiotic Product (VSL#3) CAPS, Take 2 capsules by mouth daily., Disp: , Rfl:  .  atorvastatin (LIPITOR) 40 MG tablet, Take 1 tablet (40 mg total) by mouth daily. (Patient not taking: Reported on 09/23/2016), Disp: 90 tablet, Rfl: 1 .  Cyanocobalamin (B-12) 1000 MCG SUBL, Place 1 tablet under the tongue daily. (Patient not taking: Reported on 09/23/2016), Disp: 30 each, Rfl: 0 .  metformin (FORTAMET) 1000 MG (OSM) 24 hr tablet, Take 2 tablets (2,000 mg total) by mouth every evening. (Patient not taking: Reported on 09/23/2016), Disp: 180 tablet, Rfl: 0  Allergies  Allergen Reactions  . Bydureon [Exenatide] Nausea And Vomiting  . Effexor Xr [Venlafaxine Hcl Er] Nausea And Vomiting and Other (See Comments)    "felt weird"  . Wilder Glade [Dapagliflozin] Other (See Comments)    headache  . Invokana  [Canagliflozin] Other (See Comments)    vaginitis  . Kombiglyze [Saxagliptin-Metformin Er] Diarrhea and Other (See Comments)    dizziness  . Other Other (See Comments) and Nausea Only    Diabetic Medications  . Amoxicillin Rash     ROS  Constitutional: Negative for fever or weight change.  Respiratory: Negative for cough and shortness of breath.   Cardiovascular: Negative for chest pain or palpitations.  Gastrointestinal: Negative for abdominal pain, no bowel changes.  Musculoskeletal: Negative for gait problem or joint swelling.  Skin: Negative for rash.  Neurological: Negative for dizziness or headache.  No other specific complaints in a complete review of systems (except as listed in HPI above).  Objective  Vitals:   09/23/16 1109  BP: 136/68  Pulse: (!) 108  Resp: 18  Temp: 99 F (37.2 C)  TempSrc: Oral  SpO2: 96%  Weight: 168 lb 14.4 oz (76.6 kg)  Height: 5\' 4"  (1.626 m)    Body mass index is 28.99 kg/m.  Physical Exam  Constitutional: Patient appears well-developed and well-nourished. No distress.  HENT: Head: Normocephalic and atraumatic. Ears: B TMs ok, no erythema or effusion; Nose: Nose normal. Mouth/Throat: Oropharynx is clear and moist. No oropharyngeal exudate.  Eyes: Conjunctivae and EOM are normal. Pupils are equal, round, and reactive to light. No scleral icterus.  Neck: Normal range of motion. Neck supple. No JVD present. No thyromegaly present.  Cardiovascular: Normal rate, regular rhythm and normal heart sounds.  No murmur heard. No BLE edema. Pulmonary/Chest: Effort normal and breath sounds normal. No respiratory distress. Abdominal: Soft. Bowel sounds are normal, no distension. There is no tenderness. no masses Breast: no lumps or masses, no nipple discharge or rashes FEMALE GENITALIA:  External genitalia shows atrophy  External urethra normal Vaginal vault atrophic with some petechia on walls, mild discharge no odor Cervix absent Normal  bimanual exam RECTAL: not done Musculoskeletal: Normal range of motion, no joint effusions. No gross deformities Neurological: he is alert and oriented to person, place, and time. No cranial nerve deficit. Coordination, balance, strength, speech and gait are normal.  Skin: Skin is warm and dry. No rash noted. No erythema.  Psychiatric: Patient has a normal mood and affect. behavior is normal. Judgment and thought content normal.   Recent Results (from the past 2160 hour(s))  POCT urinalysis dipstick     Status: Normal   Collection Time: 09/23/16 11:34 AM  Result Value Ref Range   Color, UA yellow    Clarity, UA cloudy    Glucose 2,000    Bilirubin, UA neg    Ketones, UA neg    Spec Grav, UA 1.010    Blood, UA neg    pH, UA 5.0    Protein, UA neg    Urobilinogen, UA negative    Nitrite, UA neg    Leukocytes, UA Negative Negative      PHQ2/9: Depression screen [facility] 2/9 09/23/2016 06/22/2016 03/02/2016 01/26/2016 11/30/2015  Decreased Interest 0 0 0 0 0  Down, Depressed, Hopeless 0 0 0 0 0  PHQ - 2 Score 0 0 0 0 0    Fall Risk: Fall Risk  09/23/2016 06/22/2016 03/02/2016 02/23/2016 01/26/2016  Falls in the past year? No No No (No Data) No     Functional Status Survey: Is the patient deaf or have difficulty hearing?: No Does the patient have difficulty seeing, even when wearing glasses/contacts?: No Does the patient have difficulty concentrating, remembering, or making decisions?: No Does the patient have difficulty walking or climbing stairs?: No Does the patient have difficulty dressing or bathing?: No Does the patient have difficulty doing errands alone such as visiting a doctor's office or shopping?: No    Assessment & Plan  1. Well woman exam  Discussed importance of 150 minutes of physical activity weekly, eat two servings of fish weekly, eat one serving of tree nuts ( cashews, pistachios, pecans, almonds.Marland Kitchen) every other day, eat 6 servings of fruit/vegetables daily and  drink plenty of water and avoid sweet beverages.   2. Mixed hyperlipidemia  - omega-3 acid ethyl esters (LOVAZA) 1 g capsule; Take 2 capsules (2 g total) by mouth 2 (two) times daily.  Dispense: 540 capsule; Refill: 1  3. Vaginal discharge  - POCT urinalysis dipstick - WET PREP FOR TRICH, YEAST, CLUE  4. Calcification of aorta ([place])  Needs to take statin therapy and aspirin daily   5. Anxiety, generalized  - ALPRAZolam (XANAX) 0.5 MG tablet; Take 1 tablet (0.5 mg total) by mouth daily as needed.  Dispense: 85 tablet; Refill: 0 - DULoxetine (CYMBALTA) 30 MG capsule; Take 1 capsule (30 mg total) by mouth daily. Increase to two daily after first week  Dispense: 60 capsule; Refill: 0   6. Type 2 diabetes, uncontrolled, with neuropathy (HCC)  - metformin (GLUCOPHAGE-XR) 750 MG 24 hr tablet; Take 2 tablets (1,500 mg total) by mouth 2 (two) times daily with a meal.  Dispense: 180 tablet; Refill: 0 - nateglinide (STARLIX) 60 MG tablet; Take 1 tablet (60 mg total) by mouth 3 (three) times daily with meals.  Dispense: 90 tablet; Refill: 0 - POCT glycosylated hemoglobin (Hb A1C)  7. Urinary frequency  - Urine Culture  8. B12 deficiency  - cyanocobalamin ((VITAMIN B-12)) injection 1,000 mcg; Inject 1 mL (1,000 mcg total) into the muscle once.

## 2016-09-26 ENCOUNTER — Other Ambulatory Visit: Payer: Self-pay | Admitting: Family Medicine

## 2016-09-26 LAB — URINE CULTURE

## 2016-09-26 LAB — WET PREP FOR TRICH, YEAST, CLUE

## 2016-09-26 MED ORDER — CIPROFLOXACIN HCL 250 MG PO TABS
250.0000 mg | ORAL_TABLET | Freq: Two times a day (BID) | ORAL | 0 refills | Status: DC
Start: 2016-09-26 — End: 2016-09-26

## 2016-09-26 MED ORDER — CIPROFLOXACIN HCL 250 MG PO TABS: 250.0000 mg | ORAL_TABLET | Freq: Two times a day (BID) | ORAL | 0 refills | Status: DC

## 2016-09-27 ENCOUNTER — Encounter: Payer: Self-pay | Admitting: Family Medicine

## 2016-09-27 LAB — VAGINITIS/VAGINOSIS, DNA PROBE
Candida Species: NEGATIVE
GARDNERELLA VAGINALIS: NEGATIVE
TRICHOMONAS VAG: NEGATIVE

## 2016-09-27 LAB — SPECIMEN STATUS REPORT

## 2016-10-03 ENCOUNTER — Other Ambulatory Visit: Payer: Self-pay | Admitting: Family Medicine

## 2016-10-03 DIAGNOSIS — E1140 Type 2 diabetes mellitus with diabetic neuropathy, unspecified: Secondary | ICD-10-CM

## 2016-10-03 DIAGNOSIS — E1165 Type 2 diabetes mellitus with hyperglycemia: Principal | ICD-10-CM

## 2016-10-03 DIAGNOSIS — IMO0002 Reserved for concepts with insufficient information to code with codable children: Secondary | ICD-10-CM

## 2016-10-03 MED ORDER — METFORMIN HCL ER 750 MG PO TB24: 1500.0000 mg | ORAL_TABLET | Freq: Two times a day (BID) | ORAL | 0 refills | Status: DC

## 2016-10-05 ENCOUNTER — Other Ambulatory Visit: Payer: Self-pay | Admitting: Family Medicine

## 2016-10-24 ENCOUNTER — Ambulatory Visit (INDEPENDENT_AMBULATORY_CARE_PROVIDER_SITE_OTHER): Payer: 59

## 2016-10-24 ENCOUNTER — Other Ambulatory Visit: Payer: Self-pay | Admitting: Family Medicine

## 2016-10-24 DIAGNOSIS — E538 Deficiency of other specified B group vitamins: Secondary | ICD-10-CM | POA: Diagnosis not present

## 2016-10-24 DIAGNOSIS — F411 Generalized anxiety disorder: Secondary | ICD-10-CM

## 2016-10-24 MED ORDER — CYANOCOBALAMIN 1000 MCG/ML IJ SOLN
1000.0000 ug | Freq: Once | INTRAMUSCULAR | Status: AC
  Administered 2016-10-24: 1000 ug via INTRAMUSCULAR

## 2016-10-24 NOTE — Progress Notes (Signed)
cyan 

## 2016-10-24 NOTE — Telephone Encounter (Signed)
Patient requesting refill of Duloxetine to [facility].  

## 2016-10-24 NOTE — Telephone Encounter (Signed)
Is she taking 30 mg or has she gone up to 60 mg as recommended?

## 2016-11-20 ENCOUNTER — Other Ambulatory Visit: Payer: Self-pay | Admitting: Family Medicine

## 2016-11-20 DIAGNOSIS — E785 Hyperlipidemia, unspecified: Secondary | ICD-10-CM

## 2016-11-29 ENCOUNTER — Other Ambulatory Visit: Payer: Self-pay | Admitting: Family Medicine

## 2016-11-29 DIAGNOSIS — F411 Generalized anxiety disorder: Secondary | ICD-10-CM

## 2016-12-01 NOTE — Telephone Encounter (Signed)
Patient requesting refill of Cymbalta, has an upcoming appointment on 12/23/2016.

## 2016-12-23 ENCOUNTER — Telehealth: Payer: Self-pay | Admitting: Family Medicine

## 2016-12-23 ENCOUNTER — Other Ambulatory Visit: Payer: Self-pay | Admitting: Family Medicine

## 2016-12-23 ENCOUNTER — Encounter: Payer: Self-pay | Admitting: Family Medicine

## 2016-12-23 ENCOUNTER — Ambulatory Visit (INDEPENDENT_AMBULATORY_CARE_PROVIDER_SITE_OTHER): Payer: 59 | Admitting: Family Medicine

## 2016-12-23 VITALS — BP 138/68 | HR 92 | Temp 98.0°F | Resp 16 | Ht 64.0 in | Wt 168.4 lb

## 2016-12-23 DIAGNOSIS — E1140 Type 2 diabetes mellitus with diabetic neuropathy, unspecified: Secondary | ICD-10-CM | POA: Diagnosis not present

## 2016-12-23 DIAGNOSIS — G8929 Other chronic pain: Secondary | ICD-10-CM | POA: Insufficient documentation

## 2016-12-23 DIAGNOSIS — E559 Vitamin D deficiency, unspecified: Secondary | ICD-10-CM

## 2016-12-23 DIAGNOSIS — E1169 Type 2 diabetes mellitus with other specified complication: Secondary | ICD-10-CM

## 2016-12-23 DIAGNOSIS — I700 Atherosclerosis of aorta: Secondary | ICD-10-CM | POA: Diagnosis not present

## 2016-12-23 DIAGNOSIS — M545 Low back pain, unspecified: Secondary | ICD-10-CM | POA: Insufficient documentation

## 2016-12-23 DIAGNOSIS — F411 Generalized anxiety disorder: Secondary | ICD-10-CM | POA: Diagnosis not present

## 2016-12-23 DIAGNOSIS — M25551 Pain in right hip: Secondary | ICD-10-CM | POA: Insufficient documentation

## 2016-12-23 DIAGNOSIS — E1165 Type 2 diabetes mellitus with hyperglycemia: Secondary | ICD-10-CM | POA: Diagnosis not present

## 2016-12-23 DIAGNOSIS — K760 Fatty (change of) liver, not elsewhere classified: Secondary | ICD-10-CM | POA: Diagnosis not present

## 2016-12-23 DIAGNOSIS — E785 Hyperlipidemia, unspecified: Secondary | ICD-10-CM

## 2016-12-23 DIAGNOSIS — K219 Gastro-esophageal reflux disease without esophagitis: Secondary | ICD-10-CM

## 2016-12-23 DIAGNOSIS — E538 Deficiency of other specified B group vitamins: Secondary | ICD-10-CM | POA: Diagnosis not present

## 2016-12-23 DIAGNOSIS — B373 Candidiasis of vulva and vagina: Secondary | ICD-10-CM

## 2016-12-23 DIAGNOSIS — B3731 Acute candidiasis of vulva and vagina: Secondary | ICD-10-CM

## 2016-12-23 DIAGNOSIS — IMO0002 Reserved for concepts with insufficient information to code with codable children: Secondary | ICD-10-CM

## 2016-12-23 LAB — POCT GLYCOSYLATED HEMOGLOBIN (HGB A1C): HEMOGLOBIN A1C: 11

## 2016-12-23 MED ORDER — LANTUS SOLOSTAR 100 UNIT/ML ~~LOC~~ SOPN: 50.0000 [IU] | PEN_INJECTOR | Freq: Every day | SUBCUTANEOUS | 0 refills | Status: DC

## 2016-12-23 MED ORDER — INSULIN LISPRO 100 UNIT/ML (KWIKPEN): 5.0000 [IU] | PEN_INJECTOR | Freq: Three times a day (TID) | SUBCUTANEOUS | 0 refills | Status: DC

## 2016-12-23 MED ORDER — CYANOCOBALAMIN 1000 MCG/ML IJ SOLN
1000.0000 ug | Freq: Once | INTRAMUSCULAR | Status: AC
  Administered 2016-12-23: 1000 ug via INTRAMUSCULAR

## 2016-12-23 MED ORDER — PANTOPRAZOLE SODIUM 40 MG PO TBEC: 40.0000 mg | DELAYED_RELEASE_TABLET | Freq: Every day | ORAL | 0 refills | Status: DC

## 2016-12-23 MED ORDER — ALPRAZOLAM 0.5 MG PO TABS: 0.5000 mg | ORAL_TABLET | Freq: Every day | ORAL | 0 refills | Status: DC | PRN

## 2016-12-23 MED ORDER — INSULIN ASPART 100 UNIT/ML FLEXPEN: 5.0000 [IU] | PEN_INJECTOR | Freq: Three times a day (TID) | SUBCUTANEOUS | 0 refills | Status: DC

## 2016-12-23 MED ORDER — FLUCONAZOLE 150 MG PO TABS: 150.0000 mg | ORAL_TABLET | ORAL | 0 refills | Status: DC

## 2016-12-23 MED ORDER — INSULIN PEN NEEDLE 32G X 4 MM MISC: 2 refills | Status: AC

## 2016-12-23 NOTE — Progress Notes (Signed)
Name: Brandi Jordan   MRN: [id number]    DOB: Mar 05, 1961   Date:12/23/2016       Progress Note  Subjective  Chief Complaint  Chief Complaint  Patient presents with  . Diabetes    6 month follow up stopped taking metfomin due to change in formulation   . Anxiety  . Dizziness    pt stated that she feels dizzy sometimes upon standing    HPI  DMII: hgbA1C was 8.7% when she saw endocrinologist back in October 2017. She states no longer seeing Dr. Nira Retort and would like to have labs done She is on Lantus 40 units daily, but stopped Metformin ER 1000mg  because she received new pills from [place] and it caused her to have upset stomach.  She is not taking pre-meal insulin and did not start starlix as recommended on her last visit.  She states she still eats late and likes to snack, seen by dietician - eating Activia yogurt and peanut butter crackers for breakfast, she still eats bread, eating out for dinner. FSBS still elevated in the 300's fasting.  She has polydipsia ad polyuria, occasional nocturia, she has also been having polyphagia. She still has recurrent vaginal discharge that is not itchy, she still has neuropathy on both feet - burning sensation ( discussed gabapentin or Lyrica - not interested, she was given Cymbalta for anxiety and pain but rx was never filled ). She has dyslipidemia.  Hyperlipidemia: she has not been taking Atorvastatin, but stopped Lovaza because per patient it caused diarrhea.   Gastritis: she has seen Dr. Durwin Reges, now on Protonix, and probiotic nausea still present intermittently, but not like it was. No longer has pain , but still has loose stools.   Atherosclerosis of Aorta: discussed importance of statin therapy and aspirin. She states she has been compliant  Anxiety: she is taking alprazolam prn, she states she is doing okay. She states very seldom has panic attacks, she wakes up during the night secondary to anxiety, discussed mindfulness, weaning off  alprazolam, she is down on medication, never started taking Cymbalta.   Low back pain: she has daily aching pain on left lower back, she states not sure if triggered by activity. She also has right inner hip pain, likely from OA, advised Tylenol . She has seen Ortho. She also had trochanteric bursa injection about one month ago that may have increased her glucose level  Patient Active Problem List   Diagnosis Date Noted  . Fatty liver 05/20/2015  . Calcification of aorta (HCC) 05/20/2015  . Recurrent vaginitis 05/20/2015  . Hiatal hernia 05/20/2015  . Trigger finger 05/11/2015  . Internal hemorrhoids without complication 64/33/2951  . Type 2 diabetes, uncontrolled, with neuropathy ([place]) 03/05/2015  . Anxiety, generalized 03/05/2015  . B12 deficiency 03/05/2015  . Hyperlipidemia 03/05/2015  . Gastritis   . Benign neoplasm of ascending colon     Past Surgical History:  Procedure Laterality Date  . ABDOMINAL HYSTERECTOMY    . BREAST BIOPSY Right    rt cyst removed  . COLONOSCOPY  09/01/2010   Iftikhar-diverticulosis only  . COLONOSCOPY WITH PROPOFOL N/A 03/03/2015   Procedure: COLONOSCOPY WITH PROPOFOL;  Surgeon: Lucilla Lame, MD;  Location: [facility] ENDOSCOPY;  Service: Endoscopy;  Laterality: N/A;  . ESOPHAGOGASTRODUODENOSCOPY (EGD) WITH PROPOFOL N/A 03/03/2015   Procedure: ESOPHAGOGASTRODUODENOSCOPY (EGD) WITH PROPOFOL;  Surgeon: Lucilla Lame, MD;  Location: [facility] ENDOSCOPY;  Service: Endoscopy;  Laterality: N/A;  . PARTIAL HYSTERECTOMY  1990   Partial  Family History  Problem Relation Age of Onset  . Cholecystitis Mother   . Kidney failure Mother   . Hypothyroidism Mother   . Diabetes Mother   . Lung cancer Father   . Leukemia Sister     chronic lymphocytic  . Diabetes Sister   . Colon cancer Maternal Aunt 54  . Hypothyroidism Daughter   . Breast cancer Maternal Aunt   . Diabetes Brother   . Cancer Sister 38    breast cancer, also has leukemia  . Liver disease Neg Hx      Social History   Social History  . Marital status: Married    Spouse name: N/A  . Number of children: 1  . Years of education: N/A   Occupational History  . 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 Smoking status: Former Smoker    Packs/day: 1.00    Years: 35.00    Types: Cigarettes    Start date: 08/29/1974    Quit date: 06/03/2010  . Smokeless tobacco: Never Used  . Alcohol use No  . Drug use: No  . Sexual activity: Not Currently   Other Topics Concern  . Not on file   Social History Narrative   Lives with husband, 1 healthy grown daughter, works in Therapist, art at [employer] Prescriptions:  .  ALPRAZolam (XANAX) 0.5 MG tablet, Take 1 tablet (0.5 mg total) by mouth daily as needed., Disp: 85 tablet, Rfl: 0 .  aspirin 81 MG tablet, Take 81 mg by mouth daily. Reported on 09/01/2015, Disp: , Rfl:  .  atorvastatin (LIPITOR) 40 MG tablet, TAKE 1 TABLET BY MOUTH  DAILY, Disp: 90 tablet, Rfl: 1 .  Cyanocobalamin (B-12) 1000 MCG SUBL, Place 1 tablet under the tongue daily. (Patient not taking: Reported on 09/23/2016), Disp: 30 each, Rfl: 0 .  DULoxetine (CYMBALTA) 60 MG capsule, Take 1 capsule (60 mg total) by mouth daily., Disp: 30 capsule, Rfl: 0 .  fluconazole (DIFLUCAN) 150 MG tablet, Take 1 tablet (150 mg total) by mouth once a week., Disp: 11 tablet, Rfl: 0 .  insulin aspart (NOVOLOG FLEXPEN) 100 UNIT/ML FlexPen, Inject 5-10 Units into the skin 3 (three) times daily with meals., Disp: 15 mL, Rfl: 0 .  Insulin Pen Needle (BD PEN NEEDLE NANO U/F) 32G X 4 MM MISC, Four times daily, Disp: 200 each, Rfl: 2 .  LANTUS SOLOSTAR 100 UNIT/ML Solostar Pen, Inject 50 Units into the skin daily., Disp: 15 mL, Rfl: 0 .  ONETOUCH VERIO test strip, CHECK  BLOOD SUGAR TWICE  DAILY, Disp: 200 each, Rfl: 5 .  pantoprazole (PROTONIX) 40 MG tablet, Take 1 tablet (40 mg total) by mouth daily., Disp: 90 tablet, Rfl: 0 .  Probiotic Product (VSL#3) CAPS, Take  2 capsules by mouth daily., Disp: , Rfl:   Allergies  Allergen Reactions  . Bydureon [Exenatide] Nausea And Vomiting  . Effexor Xr [Venlafaxine Hcl Er] Nausea And Vomiting and Other (See Comments)    "felt weird"  . Wilder Glade [Dapagliflozin] Other (See Comments)    headache  . Invokana [Canagliflozin] Other (See Comments)    vaginitis  . Kombiglyze [Saxagliptin-Metformin Er] Diarrhea and Other (See Comments)    dizziness  . Lovaza [Omega-3-Acid Ethyl Esters]     diarrhea  . Other Other (See Comments) and Nausea Only    Diabetic Medications  . Amoxicillin Rash     ROS  Constitutional: Negative for fever or weight change.  Respiratory: Negative for cough and shortness of breath.   Cardiovascular: Negative for chest pain or palpitations.  Gastrointestinal: Negative for abdominal pain, no bowel changes.  Musculoskeletal: Negative for gait problem or joint swelling.  Skin: Negative for rash.  Neurological: positive  for dizziness and  headache.  No other specific complaints in a complete review of systems (except as listed in HPI above).  Objective  Vitals:   12/23/16 1330  BP: 138/68  Pulse: 92  Resp: 16  Temp: 98 F (36.7 C)  SpO2: 95%  Weight: 168 lb 6 oz (76.4 kg)  Height: 5\' 4"  (1.626 m)    Body mass index is 28.9 kg/m.  Physical Exam  Constitutional: Patient appears well-developed and well-nourished. Obese  No distress.  HEENT: head atraumatic, normocephalic, pupils equal and reactive to light,  neck supple, throat within normal limits Cardiovascular: Normal rate, regular rhythm and normal heart sounds.  No murmur heard. No BLE edema. Pulmonary/Chest: Effort normal and breath sounds normal. No respiratory distress. Abdominal: Soft.  There is no tenderness. Psychiatric: Patient has a normal mood and affect. behavior is normal. Judgment and thought content normal. Muscular Skeletal: pain during palpation of left lower back, negative straight leg raise, pain  during internal rotation of right hip  Recent Results (from the past 2160 hour(s))  POCT HgB A1C     Status: Abnormal   Collection Time: 12/23/16  1:41 PM  Result Value Ref Range   Hemoglobin A1C 11.0       PHQ2/9: Depression screen [facility] 2/9 09/23/2016 06/22/2016 03/02/2016 01/26/2016 11/30/2015  Decreased Interest 0 0 0 0 0  Down, Depressed, Hopeless 0 0 0 0 0  PHQ - 2 Score 0 0 0 0 0     Fall Risk: Fall Risk  09/23/2016 06/22/2016 03/02/2016 02/23/2016 01/26/2016  Falls in the past year? No No No (No Data) No      Assessment & Plan  1. Type 2 diabetes, uncontrolled, with neuropathy ([place])  She saw Dr. Graceann Congress, but decided to come back because of cost ( co-pay is higher), however explained to her that she is not getting any better and it is dangerous for her DM to be so out of control. She must be compliant with diet, exercise and medication. Currently only on Lantus, stopped Metformin because of change in manufacture - it caused abdominal pain, not sure why not on Starlix and she had been given pre -meal insulin but she is not sure why she is only on Lantus at this time.  - POCT HgB A1C - LANTUS SOLOSTAR 100 UNIT/ML Solostar Pen; Inject 50 Units into the skin daily.  Dispense: 15 mL; Refill: 0 - insulin aspart (NOVOLOG FLEXPEN) 100 UNIT/ML FlexPen; Inject 5-10 Units into the skin 3 (three) times daily with meals.  Dispense: 15 mL; Refill: 0 - Insulin Pen Needle (BD PEN NEEDLE NANO U/F) 32G X 4 MM MISC; Four times daily  Dispense: 200 each; Refill: 2 - referral to Endocrinologist  2. Calcification of aorta (HCC)  Taking statin therapy, but stopped Lovaza also taking aspirin daily   3. Anxiety, generalized  - ALPRAZolam (XANAX) 0.5 MG tablet; Take 1 tablet (0.5 mg total) by mouth daily as needed.  Dispense: 60 tablet; Refill: 0  Cutting down - she is weaning self off still has some at home, she will start [place] and we will go down to 60 pills to last 3 months ( prn use)   4.  Dyslipidemia associated with type 2 diabetes  mellitus (HCC)  - LANTUS SOLOSTAR 100 UNIT/ML Solostar Pen; Inject 50 Units into the skin daily.  Dispense: 15 mL; Refill: 0 - insulin aspart (NOVOLOG FLEXPEN) 100 UNIT/ML FlexPen; Inject 5-10 Units into the skin 3 (three) times daily with meals.  Dispense: 15 mL; Refill: 0  5. Fatty liver  Discussed importance of getting DM and lipid panel under control   6. Vitamin B12 deficiency (non anemic)  She stopped getting B12 supplementation  -B12 today   7. Recurrent candidiasis of vagina  DM must be controlled to control vaginitis - fluconazole (DIFLUCAN) 150 MG tablet; Take 1 tablet (150 mg total) by mouth once a week.  Dispense: 11 tablet; Refill: 0  8. Gastroesophageal reflux disease without esophagitis  - pantoprazole (PROTONIX) 40 MG tablet; Take 1 tablet (40 mg total) by mouth daily.  Dispense: 90 tablet; Refill: 0  9. Right hip pain   10. Chronic left-sided low back pain without sciatica  Keep follow up with Ortho

## 2016-12-23 NOTE — Telephone Encounter (Signed)
Pt's insurance will not cover Novolog. It needs to be changed to Humolog.

## 2016-12-23 NOTE — Addendum Note (Signed)
Addended by: Inda Coke on: 12/23/2016 02:26 PM   Modules accepted: Orders

## 2016-12-24 ENCOUNTER — Other Ambulatory Visit: Payer: Self-pay | Admitting: Family Medicine

## 2016-12-24 DIAGNOSIS — F411 Generalized anxiety disorder: Secondary | ICD-10-CM

## 2016-12-30 LAB — COMP. METABOLIC PANEL (12)
ALBUMIN: 4.3 g/dL (ref 3.5–5.5)
ALK PHOS: 159 IU/L — AB (ref 39–117)
AST: 22 IU/L (ref 0–40)
Albumin/Globulin Ratio: 2 (ref 1.2–2.2)
BILIRUBIN TOTAL: 0.4 mg/dL (ref 0.0–1.2)
BUN/Creatinine Ratio: 24 — ABNORMAL HIGH (ref 9–23)
BUN: 16 mg/dL (ref 6–24)
CREATININE: 0.66 mg/dL (ref 0.57–1.00)
Calcium: 9.3 mg/dL (ref 8.7–10.2)
Chloride: 101 mmol/L (ref 96–106)
GFR calc Af Amer: 113 mL/min/{1.73_m2} (ref >59)
GFR calc non Af Amer: 98 mL/min/{1.73_m2} (ref >59)
GLOBULIN, TOTAL: 2.2 g/dL (ref 1.5–4.5)
Glucose: 268 mg/dL — ABNORMAL HIGH (ref 65–99)
POTASSIUM: 4.5 mmol/L (ref 3.5–5.2)
Sodium: 138 mmol/L (ref 134–144)
Total Protein: 6.5 g/dL (ref 6.0–8.5)

## 2016-12-30 LAB — LIPID PANEL
CHOL/HDL RATIO: 3.8 ratio (ref 0.0–4.4)
Cholesterol, Total: 91 mg/dL — ABNORMAL LOW (ref 100–199)
HDL: 24 mg/dL — AB (ref >39)
LDL Calculated: 37 mg/dL (ref 0–99)
TRIGLYCERIDES: 151 mg/dL — AB (ref 0–149)
VLDL CHOLESTEROL CAL: 30 mg/dL (ref 5–40)

## 2016-12-30 LAB — VITAMIN D 25 HYDROXY (VIT D DEFICIENCY, FRACTURES): VIT D 25 HYDROXY: 43.2 ng/mL (ref 30.0–100.0)

## 2017-01-09 ENCOUNTER — Telehealth: Payer: Self-pay | Admitting: Family Medicine

## 2017-01-09 NOTE — Telephone Encounter (Signed)
Pt was prescribed diflucan and has taken 3 pills and it is not working still very uncomfortable. One other time she was prescribed cipro and it worked. Would like for it to be sent to [facility] [phone]

## 2017-01-10 NOTE — Telephone Encounter (Signed)
It is likely because her glucose is high, she needs to come in for follow up, or I can send her to GYN, cipro does not treat yeast vaginitis. She can use A&D ointment

## 2017-01-19 ENCOUNTER — Other Ambulatory Visit: Payer: Self-pay | Admitting: Family Medicine

## 2017-01-19 NOTE — Telephone Encounter (Signed)
Please have patient request this from Dr. Graceann Congress with [facility]. According to Care Everywhere, Dr. Graceann Congress recently refilled both the Lantus and Humalog during her 01/11/17 visit.  I recommend she verify this medication with Dr. Delorise Shiner office.  Please apologize for any inconvenience to the patient. Thank you!

## 2017-01-19 NOTE — Telephone Encounter (Signed)
She seen Dr. Ancil Boozer 4/27, but has just seen endo?

## 2017-02-16 IMAGING — CT CT ABD-PELV W/ CM
2 of 5 series · 16 of 46 positions shown, 18 images · IV contrast (omnipaque)
Comparison: None.

CLINICAL DATA: 53-year-old female with a history of severe nausea
and abdominal cramps.

EXAM:
CT ABDOMEN AND PELVIS WITH CONTRAST
TECHNIQUE: Multidetector CT imaging of the abdomen and pelvis was performed
using the standard protocol following bolus administration of
intravenous contrast.
CONTRAST:  100mL OMNIPAQUE IOHEXOL 300 MG/ML  SOLN

[Series 2: routine with · axial · 0.79mm/px · z∈[-1100,-664]mm · 13 of 99 slices shown, 15 images]
[im 6/99  soft-tissue]
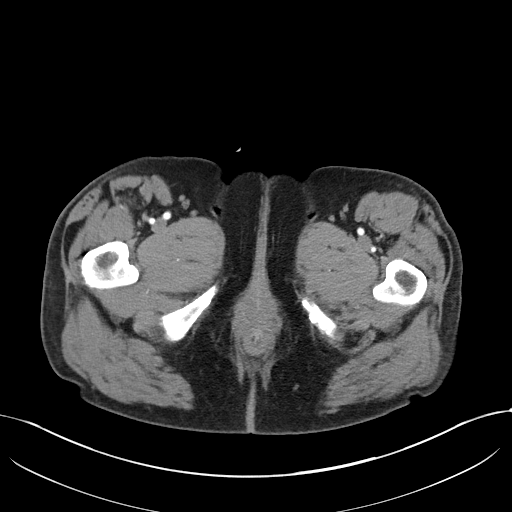
[im 6/99  bone]
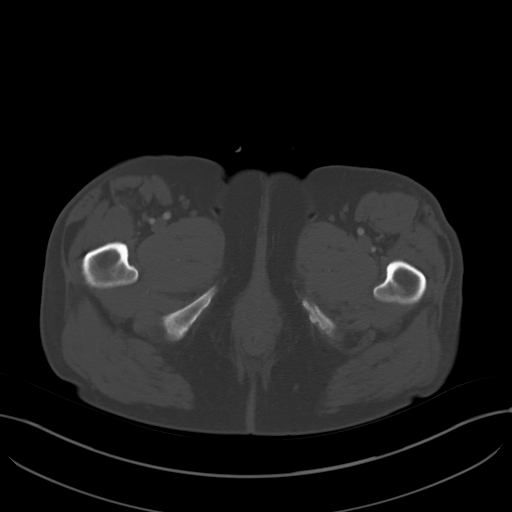
[im 16/99  soft-tissue]
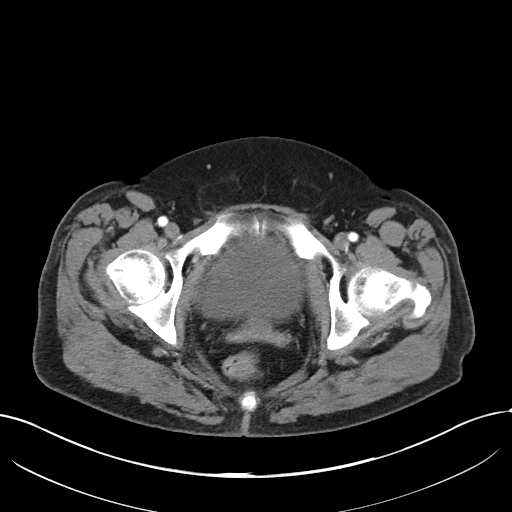
[im 21/99  soft-tissue]
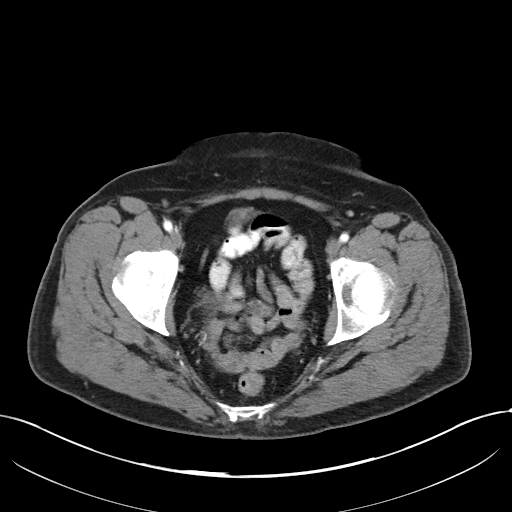
[im 26/99  soft-tissue]
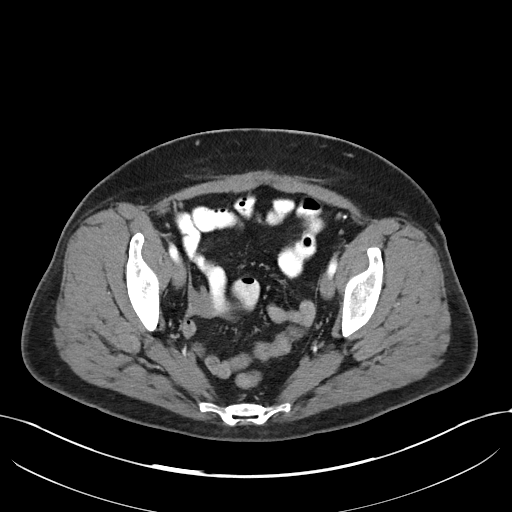
[im 37/99  soft-tissue]
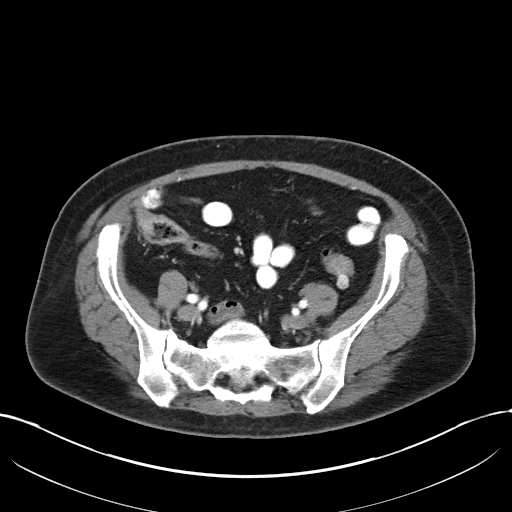
[im 42/99  soft-tissue]
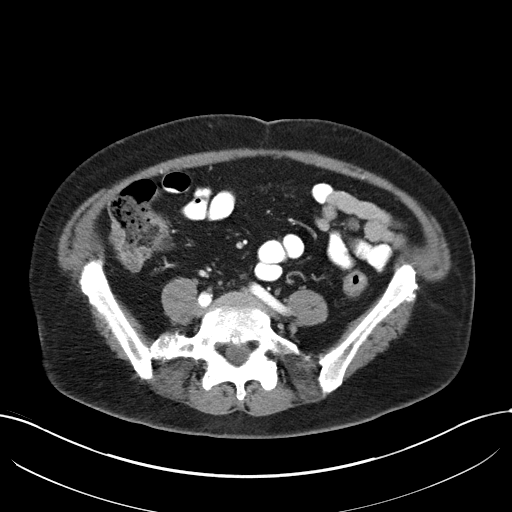
[im 52/99  soft-tissue]
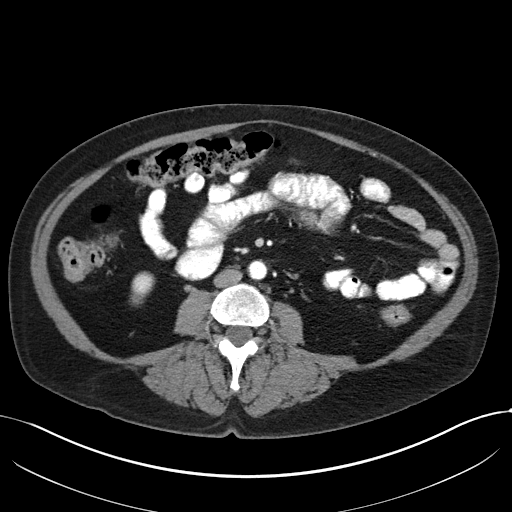
[im 57/99  soft-tissue]
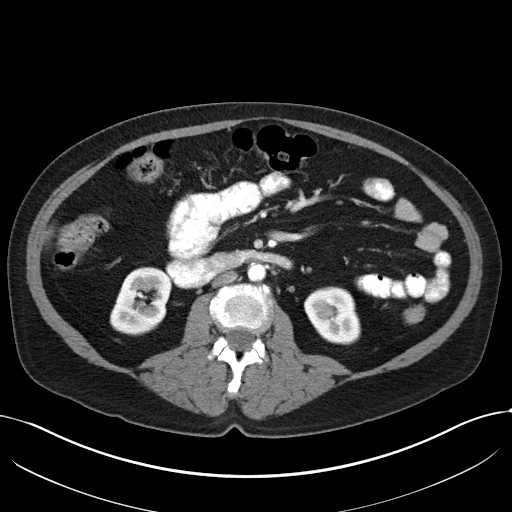
[im 62/99  soft-tissue]
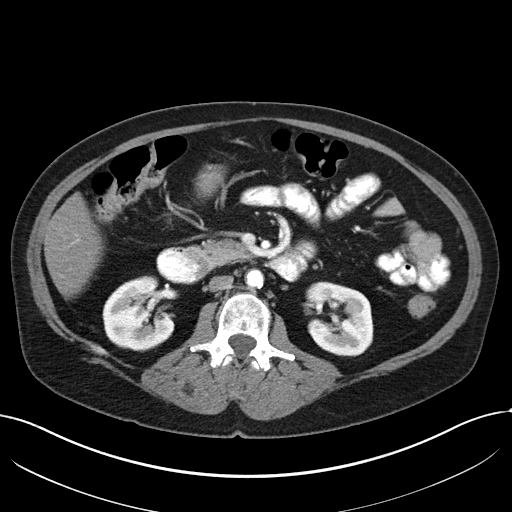
[im 62/99  bone]
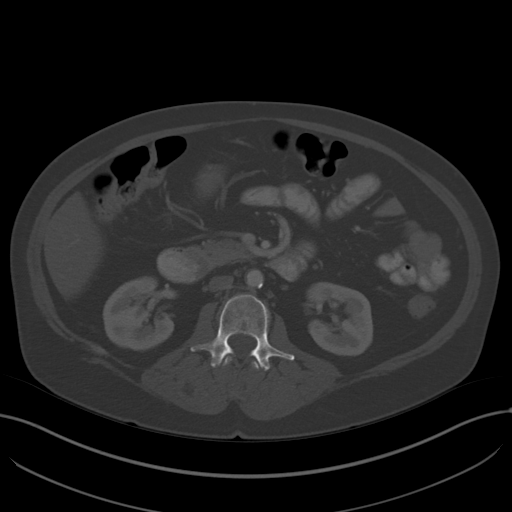
[im 73/99  soft-tissue]
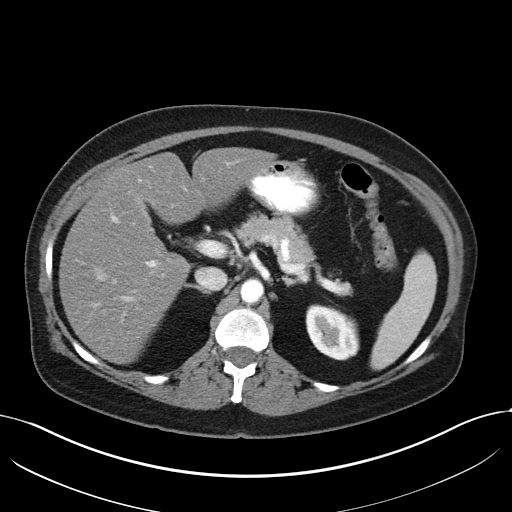
[im 78/99  soft-tissue]
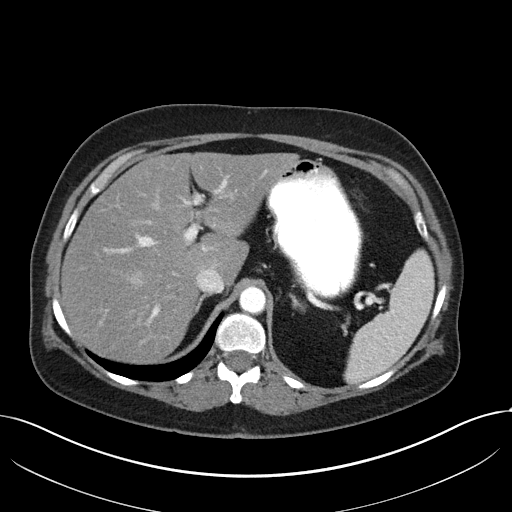
[im 83/99  soft-tissue]
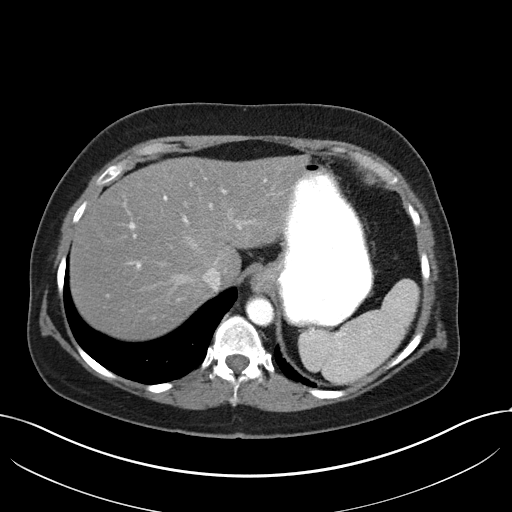
[im 93/99  soft-tissue]
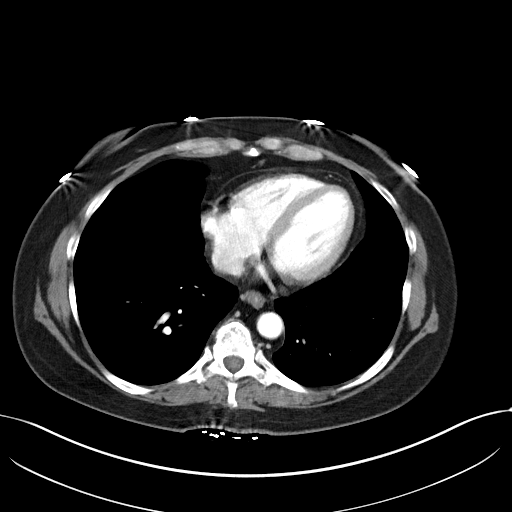

[Series 5: cor routine with · coronal · 0.74mm/px · 3 of 144 slices shown]
[im 48/144  soft-tissue]
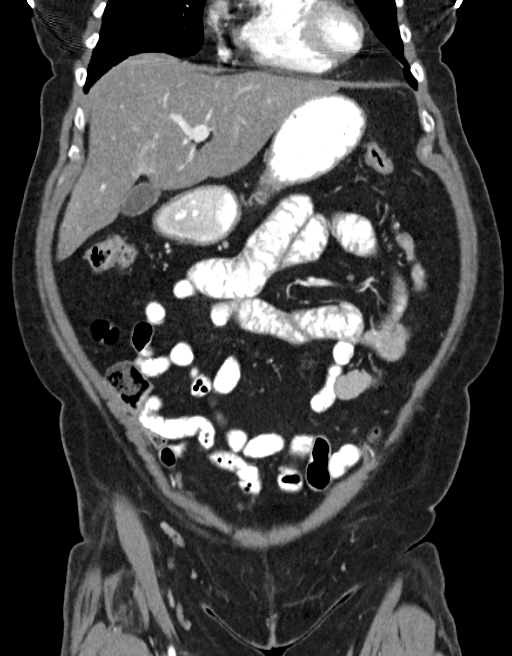
[im 64/144  soft-tissue]
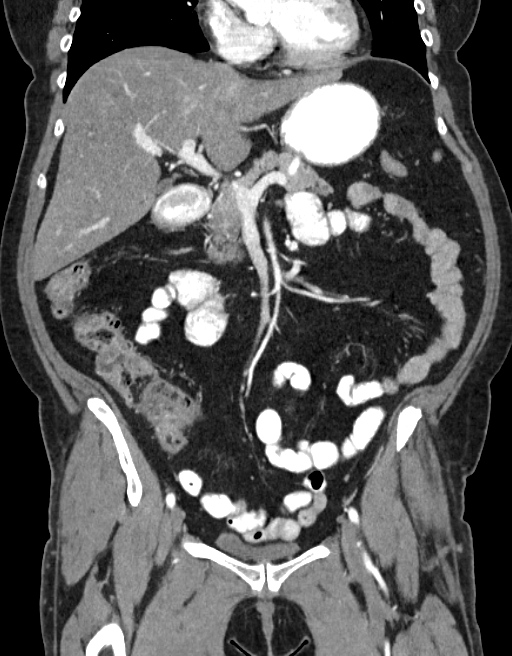
[im 80/144  soft-tissue]
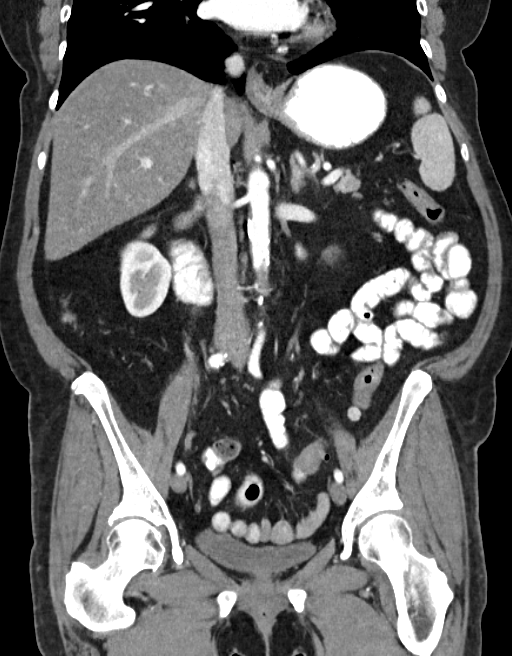

[16 of 46 positions shown; findings below may reference images not displayed]

FINDINGS: Lower chest:

Unremarkable appearance of the superficial soft tissues.

Visualized heart unremarkable.  No pericardial fluid/thickening.

Mild circumferential thickening of the distal esophagus. Small
hiatal hernia.

No confluent airspace disease.  No pleural effusion.

Abdomen:

Unremarkable appearance of the superficial soft tissues.

Diffusely decreased attenuation of the liver parenchyma. Focal fatty
sparing at the gallbladder fossa.

Unremarkable spleen.

Unremarkable adrenal glands.

Unremarkable pancreas.

Unremarkable gallbladder.

Unremarkable bilateral kidneys.

Unremarkable course of the bilateral ureters.

Unremarkable urinary bladder.

No abnormally distended small bowel or colon. No transition point.
Normal appendix.

Multiple colonic diverticula.  No associated inflammatory changes.

Surgical changes of hysterectomy. Unremarkable appearance of the
right adnexa.

New calcifications of the abdominal aorta. No aneurysm or dissection
flap.

No abdominal free fluid, free air, or peritoneal lymphadenopathy.

No displaced fracture. Minimal degenerative changes of the spine. No
bony canal narrowing.

Pseudoarticulation of the right L5 transverse process with the
sacrum.
IMPRESSION: No acute finding of the abdomen/ pelvis.

Mild circumferential thickening of the distal esophagus. Recommend
correlation with a history of symptoms of GERD, and potentially
referral for upper endoscopy.

Steatosis.

Atherosclerosis.

## 2017-04-13 ENCOUNTER — Other Ambulatory Visit: Payer: Self-pay | Admitting: Family Medicine

## 2017-04-13 DIAGNOSIS — E785 Hyperlipidemia, unspecified: Secondary | ICD-10-CM

## 2017-04-18 ENCOUNTER — Other Ambulatory Visit: Payer: Self-pay | Admitting: Family Medicine

## 2017-04-18 DIAGNOSIS — K219 Gastro-esophageal reflux disease without esophagitis: Secondary | ICD-10-CM

## 2017-06-01 ENCOUNTER — Other Ambulatory Visit: Payer: Self-pay | Admitting: Family Medicine

## 2017-06-01 DIAGNOSIS — E785 Hyperlipidemia, unspecified: Secondary | ICD-10-CM

## 2017-06-19 ENCOUNTER — Other Ambulatory Visit: Payer: Self-pay | Admitting: Family Medicine

## 2017-06-19 DIAGNOSIS — Z1231 Encounter for screening mammogram for malignant neoplasm of breast: Secondary | ICD-10-CM

## 2017-06-26 ENCOUNTER — Ambulatory Visit: Payer: 59 | Admitting: Family Medicine

## 2017-06-26 DIAGNOSIS — E785 Hyperlipidemia, unspecified: Secondary | ICD-10-CM

## 2017-06-26 DIAGNOSIS — E1169 Type 2 diabetes mellitus with other specified complication: Secondary | ICD-10-CM | POA: Insufficient documentation

## 2017-07-01 ENCOUNTER — Other Ambulatory Visit: Payer: Self-pay | Admitting: Family Medicine

## 2017-07-01 DIAGNOSIS — B3731 Acute candidiasis of vulva and vagina: Secondary | ICD-10-CM

## 2017-07-01 DIAGNOSIS — B373 Candidiasis of vulva and vagina: Secondary | ICD-10-CM

## 2017-07-08 ENCOUNTER — Other Ambulatory Visit: Payer: Self-pay | Admitting: Family Medicine

## 2017-07-08 DIAGNOSIS — K219 Gastro-esophageal reflux disease without esophagitis: Secondary | ICD-10-CM

## 2017-08-07 ENCOUNTER — Telehealth: Payer: Self-pay | Admitting: Family Medicine

## 2017-08-07 NOTE — Telephone Encounter (Signed)
Pt  inquiiring  About  Being  Seen for  Brandi Jordan  Symptoms  Today    Pt    Given info  On   E  Visit         [facility]  Or  I  Offered   To  Make  Her  Att  Later  With   PCP    She  Stated  She  Would  Try the  Virtual  Visit

## 2017-09-04 ENCOUNTER — Other Ambulatory Visit: Payer: Self-pay

## 2017-09-04 DIAGNOSIS — K219 Gastro-esophageal reflux disease without esophagitis: Secondary | ICD-10-CM

## 2017-09-04 NOTE — Telephone Encounter (Signed)
Refill request for general medication. Protonix to [facility].   Last office visit: 12/23/2016  No Follow-up on file.

## 2017-09-11 ENCOUNTER — Encounter: Payer: Self-pay | Admitting: Gastroenterology

## 2017-09-11 ENCOUNTER — Ambulatory Visit: Payer: Managed Care, Other (non HMO) | Admitting: Gastroenterology

## 2017-09-11 ENCOUNTER — Other Ambulatory Visit: Payer: Self-pay

## 2017-09-11 VITALS — BP 159/81 | HR 91 | Ht 64.0 in | Wt 162.0 lb

## 2017-09-11 DIAGNOSIS — K219 Gastro-esophageal reflux disease without esophagitis: Secondary | ICD-10-CM

## 2017-09-11 DIAGNOSIS — R112 Nausea with vomiting, unspecified: Secondary | ICD-10-CM

## 2017-09-11 DIAGNOSIS — M7070 Other bursitis of hip, unspecified hip: Secondary | ICD-10-CM | POA: Insufficient documentation

## 2017-09-11 MED ORDER — PANTOPRAZOLE SODIUM 40 MG PO TBEC: 40.0000 mg | DELAYED_RELEASE_TABLET | Freq: Every day | ORAL | 3 refills | Status: DC

## 2017-09-11 MED ORDER — PANTOPRAZOLE SODIUM 40 MG PO TBEC: 40.0000 mg | DELAYED_RELEASE_TABLET | Freq: Two times a day (BID) | ORAL | 0 refills | Status: DC

## 2017-09-11 MED ORDER — ONDANSETRON HCL 4 MG PO TABS: 4.0000 mg | ORAL_TABLET | Freq: Three times a day (TID) | ORAL | 0 refills | Status: DC | PRN

## 2017-09-11 NOTE — Progress Notes (Signed)
Primary Care Physician: Steele Sizer, MD  Primary Gastroenterologist:  Dr. Lucilla Lame  No chief complaint on file.   HPI: Brandi Jordan is a 57 y.o. female here with a history of irritable bowel syndrome with diarrhea bloating and nausea. The patient has been unable in the past to tolerate dicyclomine. The patient also had an EGD and colonoscopy that did not show any cause for her symptoms. As historically also not had any weight loss with her symptoms. The patient was tried on a probiotic last time she was here. The patient reports that she's been under a lot of stress recently. The patient is presently on Protonix. Reports that her boss at work has been giving her a hard time which has increased her stress level X neurologically. The patient denies a nausea to be worse with fatty foods or greasy foods. She also denies it to be any worse with any time of day.  Current Outpatient Medications  Medication Sig Dispense Refill  . ALPRAZolam (XANAX) 0.5 MG tablet Take 1 tablet (0.5 mg total) by mouth daily as needed. 60 tablet 0  . aspirin 81 MG tablet Take 81 mg by mouth daily. Reported on 09/01/2015    . atorvastatin (LIPITOR) 40 MG tablet TAKE 1 TABLET BY MOUTH  DAILY 90 tablet 1  . Cyanocobalamin (B-12) 1000 MCG SUBL Place 1 tablet under the tongue daily. (Patient not taking: Reported on 09/23/2016) 30 each 0  . DULoxetine (CYMBALTA) 60 MG capsule Take 1 capsule (60 mg total) by mouth daily. 30 capsule 0  . fluconazole (DIFLUCAN) 150 MG tablet Take 1 tablet (150 mg total) by mouth once a week. 11 tablet 0  . insulin lispro (HUMALOG) 100 UNIT/ML KiwkPen Inject 0.05-0.1 mLs (5-10 Units total) into the skin 3 (three) times daily. 15 mL 0  . Insulin Pen Needle (BD PEN NEEDLE NANO U/F) 32G X 4 MM MISC Four times daily 200 each 2  . LANTUS SOLOSTAR 100 UNIT/ML Solostar Pen Inject 50 Units into the skin daily. 15 mL 0  . ONETOUCH VERIO test strip CHECK  BLOOD SUGAR TWICE  DAILY 200 each 5  .  pantoprazole (PROTONIX) 40 MG tablet TAKE 1 TABLET BY MOUTH  DAILY 90 tablet 0  . Probiotic Product (VSL#3) CAPS Take 2 capsules by mouth daily.     No current facility-administered medications for this visit.     Allergies as of 09/11/2017 - Review Complete 12/23/2016  Allergen Reaction Noted  . Bydureon [exenatide] Nausea And Vomiting 06/30/2015  . Effexor xr [venlafaxine hcl er] Nausea And Vomiting and Other (See Comments) 02/02/2015  . Wilder Glade [dapagliflozin] Other (See Comments) 02/02/2015  . Invokana [canagliflozin] Other (See Comments) 02/02/2015  . Kombiglyze [saxagliptin-metformin er] Diarrhea and Other (See Comments) 02/02/2015  . Lovaza [omega-3-acid ethyl esters]  12/23/2016  . Other Other (See Comments) and Nausea Only 06/22/2015  . Amoxicillin Rash 01/29/2015    ROS:  General: Negative for anorexia, weight loss, fever, chills, fatigue, weakness. ENT: Negative for hoarseness, difficulty swallowing , nasal congestion. CV: Negative for chest pain, angina, palpitations, dyspnea on exertion, peripheral edema.  Respiratory: Negative for dyspnea at rest, dyspnea on exertion, cough, sputum, wheezing.  GI: See history of present illness. GU:  Negative for dysuria, hematuria, urinary incontinence, urinary frequency, nocturnal urination.  Endo: Negative for unusual weight change.    Physical Examination:   There were no vitals taken for this visit.  General: Well-nourished, well-developed in no acute distress.  Eyes: No icterus.  Conjunctivae pink. Mouth: Oropharyngeal mucosa moist and pink , no lesions erythema or exudate. Lungs: Clear to auscultation bilaterally. Non-labored. Heart: Regular rate and rhythm, no murmurs rubs or gallops.  Abdomen: Bowel sounds are normal, nontender, nondistended, no hepatosplenomegaly or masses, no abdominal bruits or hernia , no rebound or guarding.   Extremities: No lower extremity edema. No clubbing or deformities. Neuro: Alert and oriented  x 3.  Grossly intact. Skin: Warm and dry, no jaundice.   Psych: Alert and cooperative, normal mood and affect.  Labs:    Imaging Studies: No results found.  Assessment and Plan:   Brandi Jordan is a 57 y.o. y/o female who comes in today with increased nausea. The patient states she thinks it is from the increase in her stress levels recently. The patient has been helped recently with Zofran. The patient will try Protonix twice a day to see if any acid breakthrough is responsible for her increased nausea. The patient will also have her prescription refilled for the Protonix and for her Zofran. The patient has been explained the plan and agrees with it    Lucilla Lame, MD. Marval Regal   Note: This dictation was prepared with Dragon dictation along with smaller phrase technology. Any transcriptional errors that result from this process are unintentional.

## 2017-09-22 ENCOUNTER — Ambulatory Visit: Payer: 59 | Admitting: Family Medicine

## 2017-10-08 ENCOUNTER — Other Ambulatory Visit: Payer: Self-pay | Admitting: Gastroenterology

## 2017-10-08 DIAGNOSIS — K219 Gastro-esophageal reflux disease without esophagitis: Secondary | ICD-10-CM

## 2017-10-24 ENCOUNTER — Other Ambulatory Visit: Payer: Self-pay

## 2017-10-24 DIAGNOSIS — K219 Gastro-esophageal reflux disease without esophagitis: Secondary | ICD-10-CM

## 2017-11-04 ENCOUNTER — Other Ambulatory Visit: Payer: Self-pay | Admitting: Gastroenterology

## 2017-11-04 DIAGNOSIS — K219 Gastro-esophageal reflux disease without esophagitis: Secondary | ICD-10-CM

## 2017-11-15 ENCOUNTER — Ambulatory Visit
Admission: RE | Admit: 2017-11-15 | Discharge: 2017-11-15 | Disposition: A | Discharge status: Home / Self Care | Payer: Managed Care, Other (non HMO) | Source: Ambulatory Visit | Attending: Family Medicine | Admitting: Family Medicine

## 2017-11-15 DIAGNOSIS — Z1231 Encounter for screening mammogram for malignant neoplasm of breast: Secondary | ICD-10-CM | POA: Diagnosis not present

## 2017-12-11 ENCOUNTER — Other Ambulatory Visit: Payer: Self-pay

## 2018-01-09 ENCOUNTER — Other Ambulatory Visit: Payer: Self-pay | Admitting: Family Medicine

## 2018-01-09 DIAGNOSIS — E785 Hyperlipidemia, unspecified: Secondary | ICD-10-CM

## 2018-07-11 ENCOUNTER — Other Ambulatory Visit: Payer: Self-pay | Admitting: Gastroenterology

## 2018-07-11 ENCOUNTER — Other Ambulatory Visit: Payer: Self-pay | Admitting: Family Medicine

## 2018-07-11 DIAGNOSIS — K219 Gastro-esophageal reflux disease without esophagitis: Secondary | ICD-10-CM

## 2018-07-11 DIAGNOSIS — E785 Hyperlipidemia, unspecified: Secondary | ICD-10-CM

## 2018-09-20 ENCOUNTER — Other Ambulatory Visit: Payer: Self-pay | Admitting: Internal Medicine

## 2018-09-20 DIAGNOSIS — Z1231 Encounter for screening mammogram for malignant neoplasm of breast: Secondary | ICD-10-CM

## 2019-01-12 ENCOUNTER — Encounter: Payer: Self-pay | Admitting: Emergency Medicine

## 2019-01-12 ENCOUNTER — Emergency Department: Payer: Managed Care, Other (non HMO)

## 2019-01-12 ENCOUNTER — Emergency Department
Admission: EM | Admit: 2019-01-12 | Discharge: 2019-01-12 | Disposition: A | Discharge status: Home / Self Care | Payer: Managed Care, Other (non HMO) | Attending: Emergency Medicine | Admitting: Emergency Medicine

## 2019-01-12 ENCOUNTER — Other Ambulatory Visit: Payer: Self-pay

## 2019-01-12 DIAGNOSIS — Z87891 Personal history of nicotine dependence: Secondary | ICD-10-CM | POA: Diagnosis not present

## 2019-01-12 DIAGNOSIS — R110 Nausea: Secondary | ICD-10-CM | POA: Insufficient documentation

## 2019-01-12 DIAGNOSIS — N390 Urinary tract infection, site not specified: Secondary | ICD-10-CM | POA: Insufficient documentation

## 2019-01-12 DIAGNOSIS — Z79899 Other long term (current) drug therapy: Secondary | ICD-10-CM | POA: Diagnosis not present

## 2019-01-12 DIAGNOSIS — R109 Unspecified abdominal pain: Secondary | ICD-10-CM | POA: Diagnosis not present

## 2019-01-12 DIAGNOSIS — R1011 Right upper quadrant pain: Secondary | ICD-10-CM | POA: Insufficient documentation

## 2019-01-12 DIAGNOSIS — E119 Type 2 diabetes mellitus without complications: Secondary | ICD-10-CM | POA: Insufficient documentation

## 2019-01-12 LAB — BASIC METABOLIC PANEL
Anion gap: 8 (ref 5–15)
BUN: 14 mg/dL (ref 6–20)
CO2: 24 mmol/L (ref 22–32)
Calcium: 8.9 mg/dL (ref 8.9–10.3)
Chloride: 107 mmol/L (ref 98–111)
Creatinine, Ser: 0.52 mg/dL (ref 0.44–1.00)
GFR calc Af Amer: >60 mL/min (ref >60)
GFR calc non Af Amer: >60 mL/min (ref >60)
Glucose, Bld: 194 mg/dL — ABNORMAL HIGH (ref 70–99)
Potassium: 3.9 mmol/L (ref 3.5–5.1)
Sodium: 139 mmol/L (ref 135–145)

## 2019-01-12 LAB — CBC WITH DIFFERENTIAL/PLATELET
Abs Immature Granulocytes: 0.02 10*3/uL (ref 0.00–0.07)
Basophils Absolute: 0 10*3/uL (ref 0.0–0.1)
Basophils Relative: 1 %
Eosinophils Absolute: 0.2 10*3/uL (ref 0.0–0.5)
Eosinophils Relative: 4 %
HCT: 43.4 % (ref 36.0–46.0)
Hemoglobin: 14.4 g/dL (ref 12.0–15.0)
Immature Granulocytes: 0 %
Lymphocytes Relative: 24 %
Lymphs Abs: 1.5 10*3/uL (ref 0.7–4.0)
MCH: 28.1 pg (ref 26.0–34.0)
MCHC: 33.2 g/dL (ref 30.0–36.0)
MCV: 84.6 fL (ref 80.0–100.0)
Monocytes Absolute: 0.5 10*3/uL (ref 0.1–1.0)
Monocytes Relative: 7 %
Neutro Abs: 4.2 10*3/uL (ref 1.7–7.7)
Neutrophils Relative %: 64 %
Platelets: 202 10*3/uL (ref 150–400)
RBC: 5.13 MIL/uL — ABNORMAL HIGH (ref 3.87–5.11)
RDW: 13.4 % (ref 11.5–15.5)
WBC: 6.4 10*3/uL (ref 4.0–10.5)
nRBC: 0 % (ref 0.0–0.2)

## 2019-01-12 LAB — URINALYSIS, COMPLETE (UACMP) WITH MICROSCOPIC
Bilirubin Urine: NEGATIVE
Glucose, UA: >=500 mg/dL — AB
Hgb urine dipstick: NEGATIVE
Ketones, ur: NEGATIVE mg/dL
Nitrite: NEGATIVE
Protein, ur: NEGATIVE mg/dL
Specific Gravity, Urine: 1.023 (ref 1.005–1.030)
pH: 5 (ref 5.0–8.0)

## 2019-01-12 LAB — HEPATIC FUNCTION PANEL
ALT: 36 U/L (ref 0–44)
AST: 40 U/L (ref 15–41)
Albumin: 4.3 g/dL (ref 3.5–5.0)
Alkaline Phosphatase: 117 U/L (ref 38–126)
Bilirubin, Direct: <0.1 mg/dL (ref 0.0–0.2)
Total Bilirubin: 0.5 mg/dL (ref 0.3–1.2)
Total Protein: 7 g/dL (ref 6.5–8.1)

## 2019-01-12 LAB — LIPASE, BLOOD: Lipase: 25 U/L (ref 11–51)

## 2019-01-12 MED ORDER — CYCLOBENZAPRINE HCL 10 MG PO TABS: 10.0000 mg | ORAL_TABLET | Freq: Three times a day (TID) | ORAL | 0 refills | Status: AC | PRN

## 2019-01-12 MED ORDER — IBUPROFEN 600 MG PO TABS: 600.0000 mg | ORAL_TABLET | Freq: Four times a day (QID) | ORAL | 0 refills | Status: DC | PRN

## 2019-01-12 MED ORDER — CEPHALEXIN 500 MG PO CAPS: 500.0000 mg | ORAL_CAPSULE | Freq: Two times a day (BID) | ORAL | 0 refills | Status: AC

## 2019-01-12 MED ORDER — CEPHALEXIN 500 MG PO CAPS
500.0000 mg | ORAL_CAPSULE | Freq: Once | ORAL | Status: AC
  Administered 2019-01-12: 500 mg via ORAL
  Filled 2019-01-12: qty 1

## 2019-01-12 MED ORDER — ONDANSETRON HCL 4 MG/2ML IJ SOLN
4.0000 mg | Freq: Once | INTRAMUSCULAR | Status: AC
  Administered 2019-01-12: 4 mg via INTRAVENOUS
  Filled 2019-01-12: qty 2

## 2019-01-12 MED ORDER — KETOROLAC TROMETHAMINE 30 MG/ML IJ SOLN
30.0000 mg | Freq: Once | INTRAMUSCULAR | Status: AC
  Administered 2019-01-12: 30 mg via INTRAVENOUS
  Filled 2019-01-12: qty 1

## 2019-01-12 NOTE — ED Provider Notes (Signed)
[facility] Emergency Department Provider Note ____________________________________________   First MD Initiated Contact with Patient 01/12/19 1127     (approximate)  I have reviewed the triage vital signs and the nursing notes.   HISTORY  Chief Complaint Abdominal Pain    HPI Brandi Jordan is a 58 y.o. female with PMH as noted below who presents with right-sided back pain, gradual onset over the last 3 days, radiating around to the right subcostal area, and associated with nausea but no vomiting.  The patient reports some diarrhea recently as well.  She denies fever, chest pain, difficulty breathing, or any urinary symptoms.  She has no prior history of this pain.  She states she tried her husband's TENS unit with no relief.  Past Medical History:  Diagnosis Date  . Anxiety   . B12 deficiency   . Dermatitis   . Diabetes mellitus without complication ([place])   . Dyslipidemia   . Knee pain   . Overweight     Patient Active Problem List   Diagnosis Date Noted  . Bursitis of hip 09/11/2017  . Hyperlipidemia due to type 2 diabetes mellitus ([place]) 06/26/2017  . Chronic left-sided low back pain without sciatica 12/23/2016  . Right hip pain 12/23/2016  . Fatty liver 05/20/2015  . Calcification of aorta (H[facility]) 05/20/2015  . Recurrent vaginitis 05/20/2015  . Hiatal hernia 05/20/2015  . Trigger finger 05/11/2015  . Internal hemorrhoids without complication 85/46/2703  . Type 2 diabetes, uncontrolled, with neuropathy ([place]) 03/05/2015  . Anxiety, generalized 03/05/2015  . B12 deficiency 03/05/2015  . Hyperlipidemia 03/05/2015  . Gastritis   . Benign neoplasm of ascending colon     Past Surgical History:  Procedure Laterality Date  . ABDOMINAL HYSTERECTOMY    . BREAST BIOPSY Right    rt cyst removed  . COLONOSCOPY  09/01/2010   Iftikhar-diverticulosis only  . COLONOSCOPY WITH PROPOFOL N/A 03/03/2015   Procedure: COLONOSCOPY WITH PROPOFOL;  Surgeon:  Lucilla Lame, MD;  Location: [facility] ENDOSCOPY;  Service: Endoscopy;  Laterality: N/A;  . ESOPHAGOGASTRODUODENOSCOPY (EGD) WITH PROPOFOL N/A 03/03/2015   Procedure: ESOPHAGOGASTRODUODENOSCOPY (EGD) WITH PROPOFOL;  Surgeon: Lucilla Lame, MD;  Location: [facility] ENDOSCOPY;  Service: Endoscopy;  Laterality: N/A;  . PARTIAL HYSTERECTOMY  1990   Partial    Prior to Admission medications   Medication Sig Start Date End Date Taking? Authorizing Provider  ALPRAZolam Duanne Moron) 0.5 MG tablet Take 1 tablet (0.5 mg total) by mouth daily as needed. Patient taking differently: Take 0.5 mg by mouth 2 (two) times daily as needed for anxiety or sleep.  12/23/16  Yes Sowles, Drue Stager, MD  atorvastatin (LIPITOR) 40 MG tablet TAKE 1 TABLET BY MOUTH  DAILY Patient taking differently: Take 40 mg by mouth daily.  01/09/18  Yes Sowles, Drue Stager, MD  insulin lispro (HUMALOG) 100 UNIT/ML KiwkPen Inject 0.05-0.1 mLs (5-10 Units total) into the skin 3 (three) times daily. Patient taking differently: Inject 5 Units into the skin 3 (three) times daily with meals. Use in addition to sliding scale up to 45u daily 12/23/16  Yes Sowles, Drue Stager, MD  Insulin Pen Needle (BD PEN NEEDLE NANO U/F) 32G X 4 MM MISC Four times daily 12/23/16  Yes Sowles, Drue Stager, MD  LANTUS SOLOSTAR 100 UNIT/ML Solostar Pen Inject 50 Units into the skin daily. Patient taking differently: Inject 65 Units into the skin daily.  12/23/16  Yes Sowles, Drue Stager, MD  losartan (COZAAR) 25 MG tablet Take 25 mg by mouth daily. 11/12/18  Yes  [provider]  ONETOUCH VERIO test strip CHECK  BLOOD SUGAR TWICE  DAILY Patient taking differently: 1 each by Other route 2 (two) times a day.  10/05/16  Yes Sowles, Drue Stager, MD  pantoprazole (PROTONIX) 40 MG tablet TAKE 1 TABLET BY MOUTH  DAILY Patient taking differently: Take 40 mg by mouth daily.  07/19/18  Yes Lucilla Lame, MD  cephALEXin (KEFLEX) 500 MG capsule Take 1 capsule (500 mg total) by mouth 2 (two) times daily for 7 days.  01/12/19 01/19/19  Arta Silence, MD  cyclobenzaprine (FLEXERIL) 10 MG tablet Take 1 tablet (10 mg total) by mouth 3 (three) times daily as needed for up to 5 days for muscle spasms. 01/12/19 01/17/19  Arta Silence, MD  ibuprofen (ADVIL) 600 MG tablet Take 1 tablet (600 mg total) by mouth every 6 (six) hours as needed. 01/12/19   Arta Silence, MD    Allergies Bydureon [exenatide]; Effexor xr [venlafaxine hcl er]; Farxiga [dapagliflozin]; Invokana [canagliflozin]; Kombiglyze [saxagliptin-metformin er]; Lovaza [omega-3-acid ethyl esters]; Other; and Amoxicillin  Family History  Problem Relation Age of Onset  . Cholecystitis Mother   . Kidney failure Mother   . Hypothyroidism Mother   . Diabetes Mother   . Lung cancer Father   . Leukemia Sister        chronic lymphocytic  . Diabetes Sister   . Colon cancer Maternal Aunt 22  . Hypothyroidism Daughter   . Breast cancer Maternal Aunt   . Diabetes Brother   . Cancer Sister 34       breast cancer, also has leukemia  . Liver disease Neg Hx     Social History Social History   Tobacco Use  . Smoking status: Former Smoker    Packs/day: 1.00    Years: 35.00    Pack years: 35.00    Types: Cigarettes    Start date: 08/29/1974    Last attempt to quit: 06/03/2010    Years since quitting: 8.6  . Smokeless tobacco: Never Used  Substance Use Topics  . Alcohol use: No    Alcohol/week: 0.0 standard drinks  . Drug use: No    Review of Systems  Constitutional: No fever. Eyes: No numbness. ENT: No neck pain. Cardiovascular: Denies chest pain. Respiratory: Denies shortness of breath. Gastrointestinal: Positive for nausea. Genitourinary: Negative for dysuria or hematuria.  Musculoskeletal: Positive for back pain. Skin: Negative for rash. Neurological: Negative for headache.   ____________________________________________   PHYSICAL EXAM:  VITAL SIGNS: ED Triage Vitals  Enc Vitals Group     BP 01/12/19 1110 (!)  166/88     Pulse Rate 01/12/19 1108 94     Resp 01/12/19 1108 16     Temp 01/12/19 1108 98.3 F (36.8 C)     Temp Source 01/12/19 1108 Oral     SpO2 01/12/19 1108 98 %     Weight 01/12/19 1108 176 lb (79.8 kg)     Height 01/12/19 1108 5\' 4"  (1.626 m)     Head Circumference --      Peak Flow --      Pain Score 01/12/19 1107 7     Pain Loc --      Pain Edu? --      Excl. in [place]? --     Constitutional: Alert and oriented.  Relatively well appearing and in no acute distress. Eyes: Conjunctivae are normal.  No scleral icterus. Head: Atraumatic. Nose: No congestion/rhinnorhea. Mouth/Throat: Mucous membranes are moist.   Neck: Normal range of motion.  Cardiovascular: Good peripheral circulation. Respiratory: Normal respiratory effort.  No retractions. Gastrointestinal: Soft with moderate right upper quadrant tenderness.  No distention.  Genitourinary: No CVA tenderness. Musculoskeletal: No lower extremity edema.  Extremities warm and well perfused.  No midline spinal tenderness.  No significant right-sided paraspinal muscle tenderness. Neurologic:  Normal speech and language.  Motor intact in all extremities.  No gross focal neurologic deficits are appreciated.  Skin:  Skin is warm and dry. No rash noted. Psychiatric: Mood and affect are normal. Speech and behavior are normal.  ____________________________________________   LABS (all labs ordered are listed, but only abnormal results are displayed)  Labs Reviewed  URINALYSIS, COMPLETE (UACMP) WITH MICROSCOPIC - Abnormal; Notable for the following components:      Result Value   Color, Urine YELLOW (*)    APPearance HAZY (*)    Glucose, UA >=500 (*)    Leukocytes,Ua MODERATE (*)    Bacteria, UA RARE (*)    All other components within normal limits  CBC WITH DIFFERENTIAL/PLATELET - Abnormal; Notable for the following components:   RBC 5.13 (*)    All other components within normal limits  BASIC METABOLIC PANEL - Abnormal;  Notable for the following components:   Glucose, Bld 194 (*)    All other components within normal limits  HEPATIC FUNCTION PANEL  LIPASE, BLOOD   ____________________________________________  EKG   ____________________________________________  RADIOLOGY  US abdomen RUQ: No acute abnormality  ____________________________________________   PROCEDURES  Procedure(s) performed: No  Procedures  Critical Care performed: No ____________________________________________   INITIAL IMPRESSION / ASSESSMENT AND PLAN / ED COURSE  Pertinent labs & imaging results that were available during my care of the patient were reviewed by me and considered in my medical decision making (see chart for details).  58 year old female with PMH as noted above presents with right-sided back pain radiating to the right subcostal area over the last few days and associated with nausea.  No history of trauma and no prior history of this pain.  On exam, the patient is well-appearing.  Her vital signs are normal except for mild hypertension.  The abdomen is soft but she does have right upper quadrant tenderness.  She does not have significant tenderness to the right side of the back where the pain is.  Overall I am most concerned for biliary colic versus cholecystitis, although differential also includes pyelonephritis or ureteral stone, or musculoskeletal cause.  We will obtain lab work-up, UA, right upper quadrant ultrasound, and reassess.  ----------------------------------------- 2:37 PM on 01/12/2019 -----------------------------------------  Ultrasound shows no gallstones or other acute abnormality.  The lab work-up is unremarkable, except for the UA which shows WBCs, bacteria, and leukocytes.  I suspect that the patient could be having UTI/early pyelonephritis which would explain these findings and the pain.  The patient is comfortable appearing after the Toradol and Zofran.  I counseled her on the  results of the work-up.  At this time there is no indication for further ED work-up or observation.  She is stable for discharge home.  I will prescribe Keflex for UTI and ibuprofen and Flexeril for the pain in case it is more of a muscular strain/spasm.  The patient agrees with the plan.  Return precautions given, and she expresses understanding. ____________________________________________   FINAL CLINICAL IMPRESSION(S) / ED DIAGNOSES  Final diagnoses:  Right upper quadrant abdominal pain  Right flank pain  Urinary tract infection without hematuria, site unspecified      NEW MEDICATIONS STARTED DURING  THIS VISIT:  New Prescriptions   CEPHALEXIN (KEFLEX) 500 MG CAPSULE    Take 1 capsule (500 mg total) by mouth 2 (two) times daily for 7 days.   CYCLOBENZAPRINE (FLEXERIL) 10 MG TABLET    Take 1 tablet (10 mg total) by mouth 3 (three) times daily as needed for up to 5 days for muscle spasms.   IBUPROFEN (ADVIL) 600 MG TABLET    Take 1 tablet (600 mg total) by mouth every 6 (six) hours as needed.     Note:  This document was prepared using Dragon voice recognition software and may include unintentional dictation errors.    Arta Silence, MD 01/12/19 1439

## 2019-01-12 NOTE — ED Triage Notes (Signed)
Pt to ED iva POV c/o upper right sided back pain that radiates into the RUQ of her abdomen. Pt is also having nausea. Pt has been having the pain since Wednesday. Pain is worse after eating. Pt still has Gallbladder. Pt is in NAD.

## 2019-01-12 NOTE — ED Notes (Signed)
Patient transported to Ultrasound 

## 2019-01-12 NOTE — ED Notes (Signed)
Per pt back pain x 3 days -indicates rt mid back. States she has been using her pain patch and her husbands tens unit with no relief. States these items usually help but feels nauseated today. Given urine cup for sample.

## 2019-01-12 NOTE — Discharge Instructions (Addendum)
Take the antibiotic as prescribed and finish the full course.  You can take the ibuprofen every 6 hours as needed for pain, and the Flexeril up to 3 times daily as needed.  Return to the ER for new, worsening, or persistent severe pain, vomiting, fever, weakness, or any other new or worsening symptoms that concern you.  Make an appointment to follow-up with your primary care doctor within the next few weeks.

## 2019-02-04 ENCOUNTER — Other Ambulatory Visit: Payer: Self-pay | Admitting: Gastroenterology

## 2019-02-04 DIAGNOSIS — K219 Gastro-esophageal reflux disease without esophagitis: Secondary | ICD-10-CM

## 2019-03-07 ENCOUNTER — Encounter: Payer: Self-pay | Admitting: *Deleted

## 2019-05-27 ENCOUNTER — Other Ambulatory Visit: Payer: Self-pay | Admitting: Gastroenterology

## 2019-05-27 DIAGNOSIS — K219 Gastro-esophageal reflux disease without esophagitis: Secondary | ICD-10-CM

## 2019-09-11 ENCOUNTER — Ambulatory Visit
Admission: RE | Admit: 2019-09-11 | Discharge: 2019-09-11 | Disposition: A | Discharge status: Home / Self Care | Payer: Managed Care, Other (non HMO) | Source: Ambulatory Visit | Attending: Internal Medicine | Admitting: Internal Medicine

## 2019-09-11 DIAGNOSIS — Z1231 Encounter for screening mammogram for malignant neoplasm of breast: Secondary | ICD-10-CM | POA: Diagnosis present

## 2020-03-10 ENCOUNTER — Other Ambulatory Visit: Payer: Self-pay

## 2020-03-10 ENCOUNTER — Other Ambulatory Visit: Payer: Managed Care, Other (non HMO)

## 2020-03-10 ENCOUNTER — Encounter
Admission: RE | Admit: 2020-03-10 | Discharge: 2020-03-10 | Disposition: A | Discharge status: Home / Self Care | Payer: Managed Care, Other (non HMO) | Source: Ambulatory Visit | Attending: Orthopedic Surgery | Admitting: Orthopedic Surgery

## 2020-03-10 ENCOUNTER — Other Ambulatory Visit: Payer: Self-pay | Admitting: Orthopedic Surgery

## 2020-03-10 HISTORY — DX: Gastro-esophageal reflux disease without esophagitis: K21.9

## 2020-03-10 HISTORY — DX: Unspecified osteoarthritis, unspecified site: M19.90

## 2020-03-10 NOTE — Patient Instructions (Signed)
COVID TESTING Date: Testing site:  [facility] [address] Thru Hours:  8:32 am - 1:00 pm Once you are tested, you are asked to stay quarantined (avoiding public places) until after your surgery.   Your procedure is scheduled on: Report to Day Surgery on the 2nd floor of the [facility]. To find out your arrival time, please call [phone] between 1PM - 3PM on: Friday   REMEMBER: Instructions that are not followed completely may result in serious medical risk, up to and including death; or upon the discretion of your surgeon and anesthesiologist your surgery may need to be rescheduled.  Do not eat food after midnight the night before surgery.  No gum chewing, lozengers or hard candies.  You may however, drink CLEAR liquids up to 2 hours before you are scheduled to arrive for your surgery. Do not drink anything within 2 hours of your scheduled arrival time.  Clear liquids include: - water   Do NOT drink anything that is not on this list.  Type 1 and Type 2 diabetics should only drink water.   TAKE THESE MEDICATIONS THE MORNING OF SURGERY WITH A SIP OF WATER: pantoprazole (take one the night before and one on the morning of surgery - helps to prevent nausea after surgery.)  Take 1/2 of usual insulin dose the night before surgery and none on the morning of surgery.  Follow recommendations from Cardiologist, Pulmonologist or PCP regarding stopping Aspirin, Coumadin, Plavix, Eliquis, Pradaxa, or Pletal.  Stop Anti-inflammatories (NSAIDS) such as Advil, Aleve, Ibuprofen, Motrin, Naproxen, Naprosyn and Aspirin based products such as Excedrin, Goodys Powder, BC Powder.  Diclofenac patch (May take Tylenol or Acetaminophen if needed.)  Stop ANY OVER THE COUNTER supplements until after surgery. (May continue Vitamin D, Vitamin B, and multivitamin.)  No Alcohol for 24 hours before or after surgery.  No Smoking including e-cigarettes for 24 hours  prior to surgery.  No chewable tobacco products for at least 6 hours prior to surgery.  No nicotine patches on the day of surgery.  Do not use any "recreational" drugs for at least a week prior to your surgery.  Please be advised that the combination of cocaine and anesthesia may have negative outcomes, up to and including death. If you test positive for cocaine, your surgery will be cancelled.  On the morning of surgery brush your teeth with toothpaste and water, you may rinse your mouth with mouthwash if you wish. Do not swallow any toothpaste or mouthwash.  Do not wear jewelry, make-up, hairpins, clips or nail polish.  Do not wear lotions, powders, or perfumes.   Do not shave 48 hours prior to surgery.   Contact lenses, hearing aids and dentures may not be worn into surgery.  Do not bring valuables to the hospital. [facility] is not responsible for any missing/lost belongings or valuables.   Use CHG Soap as directed on instruction sheet.  Notify your doctor if there is any change in your medical condition (cold, fever, infection).  Wear comfortable clothing (specific to your surgery type) to the hospital.  Plan for stool softeners for home use; pain medications have a tendency to cause constipation. You can also help prevent constipation by eating foods high in fiber such as fruits and vegetables and drinking plenty of fluids as your diet allows.  After surgery, you can help prevent lung complications by doing breathing exercises.  Take deep breaths and cough every 1-2 hours. Your doctor may order a  device called an Incentive Spirometer to help you take deep breaths. When coughing or sneezing, hold a pillow firmly against your incision with both hands. This is called "splinting." Doing this helps protect your incision. It also decreases belly discomfort.   If you are being discharged the day of surgery, you will not be allowed to drive home. You will need a responsible adult  (18 years or older) to drive you home and stay with you that night.   If you are taking public transportation, you will need to have a responsible adult (18 years or older) with you. Please confirm with your physician that it is acceptable to use public transportation.   Please call the [facility]. at [phone] if you have any questions about these instructions.  Visitation Policy:  Patients undergoing a surgery or procedure may have one family member or support person with them as long as that person is not COVID-19 positive or experiencing its symptoms.  That person may remain in the waiting area during the procedure.  Children under 70 years of age may have both parents or legal guardians with them during their procedure.  Inpatient Visitation Update:   Two designated support people may visit a patient during visiting hours 7 am to 8 pm. It must be the same two designated people for the duration of the patient stay. The visitors may come and go during the day, and there is no switching out to have different visitors. A mask must be worn at all times, including in the patient room.  Children under 40 years of age:  a total of 4 designated visitors for the child's entire stay are allowed. Only 2 in the room at a time and only one staying overnight at a time. The overnight guest can now rotate during the child's hospital stay.  As a reminder, masks are still required for all [facility] team members, patients and visitors in all [facility] facilities.   Systemwide, no visitors 17 or younger.

## 2020-03-12 ENCOUNTER — Other Ambulatory Visit: Payer: Self-pay

## 2020-03-12 ENCOUNTER — Encounter
Admission: RE | Admit: 2020-03-12 | Discharge: 2020-03-12 | Disposition: A | Discharge status: Home / Self Care | Payer: Managed Care, Other (non HMO) | Source: Ambulatory Visit | Attending: Orthopedic Surgery | Admitting: Orthopedic Surgery

## 2020-03-12 DIAGNOSIS — Z01818 Encounter for other preprocedural examination: Secondary | ICD-10-CM | POA: Diagnosis not present

## 2020-03-12 DIAGNOSIS — I10 Essential (primary) hypertension: Secondary | ICD-10-CM | POA: Insufficient documentation

## 2020-03-12 DIAGNOSIS — Z20822 Contact with and (suspected) exposure to covid-19: Secondary | ICD-10-CM | POA: Insufficient documentation

## 2020-03-12 LAB — SARS CORONAVIRUS 2 (TAT 6-24 HRS): SARS Coronavirus 2: NEGATIVE

## 2020-03-16 ENCOUNTER — Encounter
Admission: RE | Disposition: A | Discharge status: Home / Self Care | Payer: Self-pay | Source: Home / Self Care | Attending: Orthopedic Surgery

## 2020-03-16 ENCOUNTER — Other Ambulatory Visit: Payer: Self-pay

## 2020-03-16 ENCOUNTER — Encounter: Payer: Self-pay | Admitting: Orthopedic Surgery

## 2020-03-16 ENCOUNTER — Ambulatory Visit: Payer: Managed Care, Other (non HMO) | Admitting: Anesthesiology

## 2020-03-16 ENCOUNTER — Ambulatory Visit
Admission: RE | Admit: 2020-03-16 | Discharge: 2020-03-16 | Disposition: A | Discharge status: Home / Self Care | Payer: Managed Care, Other (non HMO) | Attending: Orthopedic Surgery | Admitting: Orthopedic Surgery

## 2020-03-16 DIAGNOSIS — X58XXXA Exposure to other specified factors, initial encounter: Secondary | ICD-10-CM | POA: Insufficient documentation

## 2020-03-16 DIAGNOSIS — E119 Type 2 diabetes mellitus without complications: Secondary | ICD-10-CM | POA: Diagnosis not present

## 2020-03-16 DIAGNOSIS — M2341 Loose body in knee, right knee: Secondary | ICD-10-CM | POA: Diagnosis not present

## 2020-03-16 DIAGNOSIS — K219 Gastro-esophageal reflux disease without esophagitis: Secondary | ICD-10-CM | POA: Diagnosis not present

## 2020-03-16 DIAGNOSIS — M23251 Derangement of posterior horn of lateral meniscus due to old tear or injury, right knee: Secondary | ICD-10-CM | POA: Diagnosis not present

## 2020-03-16 DIAGNOSIS — F419 Anxiety disorder, unspecified: Secondary | ICD-10-CM | POA: Diagnosis not present

## 2020-03-16 DIAGNOSIS — S83206A Unspecified tear of unspecified meniscus, current injury, right knee, initial encounter: Secondary | ICD-10-CM | POA: Diagnosis present

## 2020-03-16 DIAGNOSIS — Z794 Long term (current) use of insulin: Secondary | ICD-10-CM | POA: Diagnosis not present

## 2020-03-16 DIAGNOSIS — S83231A Complex tear of medial meniscus, current injury, right knee, initial encounter: Secondary | ICD-10-CM | POA: Insufficient documentation

## 2020-03-16 DIAGNOSIS — M2241 Chondromalacia patellae, right knee: Secondary | ICD-10-CM | POA: Diagnosis not present

## 2020-03-16 DIAGNOSIS — Z87891 Personal history of nicotine dependence: Secondary | ICD-10-CM | POA: Diagnosis not present

## 2020-03-16 HISTORY — PX: KNEE ARTHROSCOPY WITH MEDIAL MENISECTOMY: SHX5651

## 2020-03-16 LAB — GLUCOSE, CAPILLARY
Glucose-Capillary: 175 mg/dL — ABNORMAL HIGH (ref 70–99)
Glucose-Capillary: 129 mg/dL — ABNORMAL HIGH (ref 70–99)

## 2020-03-16 SURGERY — ARTHROSCOPY, KNEE, WITH MEDIAL MENISCECTOMY
Anesthesia: General | Site: Knee | Laterality: Right

## 2020-03-16 MED ORDER — EPINEPHRINE PF 1 MG/ML IJ SOLN
INTRAMUSCULAR | Status: AC
  Filled 2020-03-16: qty 4

## 2020-03-16 MED ORDER — BUPIVACAINE-EPINEPHRINE (PF) 0.25% -1:200000 IJ SOLN
INTRAMUSCULAR | Status: AC
  Filled 2020-03-16: qty 30

## 2020-03-16 MED ORDER — FENTANYL CITRATE (PF) 100 MCG/2ML IJ SOLN
INTRAMUSCULAR | Status: AC
  Filled 2020-03-16: qty 2

## 2020-03-16 MED ORDER — FENTANYL CITRATE (PF) 100 MCG/2ML IJ SOLN
25.0000 ug | INTRAMUSCULAR | Status: DC | PRN
  Administered 2020-03-16 (×3): 25 ug via INTRAVENOUS

## 2020-03-16 MED ORDER — CHLORHEXIDINE GLUCONATE 0.12 % MT SOLN: 15.0000 mL | Freq: Once | OROMUCOSAL | Status: AC

## 2020-03-16 MED ORDER — METOCLOPRAMIDE HCL 10 MG PO TABS: 5.0000 mg | ORAL_TABLET | Freq: Three times a day (TID) | ORAL | Status: DC | PRN

## 2020-03-16 MED ORDER — CLINDAMYCIN PHOSPHATE 900 MG/50ML IV SOLN
INTRAVENOUS | Status: AC
Start: 2020-03-16 — End: 2020-03-16
  Filled 2020-03-16: qty 50

## 2020-03-16 MED ORDER — MIDAZOLAM HCL 2 MG/2ML IJ SOLN
INTRAMUSCULAR | Status: AC
  Filled 2020-03-16: qty 2

## 2020-03-16 MED ORDER — LACTATED RINGERS IV SOLN
INTRAVENOUS | Status: DC | PRN
  Administered 2020-03-16: 2400 mL

## 2020-03-16 MED ORDER — SODIUM CHLORIDE 0.9 % IV SOLN: INTRAVENOUS | Status: DC

## 2020-03-16 MED ORDER — METHYLPREDNISOLONE ACETATE 40 MG/ML IJ SUSP
INTRAMUSCULAR | Status: DC | PRN
  Administered 2020-03-16: 20 mg

## 2020-03-16 MED ORDER — OXYCODONE HCL 5 MG PO TABS
5.0000 mg | ORAL_TABLET | Freq: Once | ORAL | Status: AC | PRN
  Administered 2020-03-16: 5 mg via ORAL

## 2020-03-16 MED ORDER — PROMETHAZINE HCL 25 MG/ML IJ SOLN: 6.2500 mg | INTRAMUSCULAR | Status: DC | PRN

## 2020-03-16 MED ORDER — LACTATED RINGERS IV SOLN: INTRAVENOUS | Status: DC | PRN

## 2020-03-16 MED ORDER — ACETAMINOPHEN 10 MG/ML IV SOLN
INTRAVENOUS | Status: AC
  Filled 2020-03-16: qty 100

## 2020-03-16 MED ORDER — ROPIVACAINE HCL 5 MG/ML IJ SOLN
INTRAMUSCULAR | Status: AC
  Filled 2020-03-16: qty 20

## 2020-03-16 MED ORDER — PROPOFOL 10 MG/ML IV BOLUS
INTRAVENOUS | Status: DC | PRN
  Administered 2020-03-16: 160 mg via INTRAVENOUS

## 2020-03-16 MED ORDER — LIDOCAINE HCL (CARDIAC) PF 100 MG/5ML IV SOSY
PREFILLED_SYRINGE | INTRAVENOUS | Status: DC | PRN
  Administered 2020-03-16: 80 mg via INTRAVENOUS

## 2020-03-16 MED ORDER — FENTANYL CITRATE (PF) 100 MCG/2ML IJ SOLN
INTRAMUSCULAR | Status: AC
  Administered 2020-03-16: 25 ug via INTRAVENOUS
  Filled 2020-03-16: qty 2

## 2020-03-16 MED ORDER — ACETAMINOPHEN 10 MG/ML IV SOLN
INTRAVENOUS | Status: DC | PRN
  Administered 2020-03-16: 1000 mg via INTRAVENOUS

## 2020-03-16 MED ORDER — DEXAMETHASONE SODIUM PHOSPHATE 10 MG/ML IJ SOLN
INTRAMUSCULAR | Status: AC
  Filled 2020-03-16: qty 1

## 2020-03-16 MED ORDER — DOCUSATE SODIUM 100 MG PO CAPS
100.0000 mg | ORAL_CAPSULE | Freq: Every day | ORAL | 2 refills | Status: AC | PRN
Start: 2020-03-16 — End: 2021-03-16

## 2020-03-16 MED ORDER — HYDROCODONE-ACETAMINOPHEN 5-325 MG PO TABS: 1.0000 | ORAL_TABLET | ORAL | 0 refills | Status: AC | PRN

## 2020-03-16 MED ORDER — ORAL CARE MOUTH RINSE: 15.0000 mL | Freq: Once | OROMUCOSAL | Status: AC

## 2020-03-16 MED ORDER — FENTANYL CITRATE (PF) 100 MCG/2ML IJ SOLN
INTRAMUSCULAR | Status: DC | PRN
  Administered 2020-03-16 (×2): 25 ug via INTRAVENOUS

## 2020-03-16 MED ORDER — CHLORHEXIDINE GLUCONATE 0.12 % MT SOLN
OROMUCOSAL | Status: AC
  Administered 2020-03-16: 15 mL via OROMUCOSAL
  Filled 2020-03-16: qty 15

## 2020-03-16 MED ORDER — ROPIVACAINE HCL 5 MG/ML IJ SOLN
INTRAMUSCULAR | Status: DC | PRN
  Administered 2020-03-16: 30 mL

## 2020-03-16 MED ORDER — ONDANSETRON HCL 4 MG/2ML IJ SOLN
INTRAMUSCULAR | Status: AC
  Filled 2020-03-16: qty 2

## 2020-03-16 MED ORDER — METOCLOPRAMIDE HCL 5 MG/ML IJ SOLN: 5.0000 mg | Freq: Three times a day (TID) | INTRAMUSCULAR | Status: DC | PRN

## 2020-03-16 MED ORDER — OXYCODONE HCL 5 MG PO TABS
ORAL_TABLET | ORAL | Status: AC
  Filled 2020-03-16: qty 1

## 2020-03-16 MED ORDER — METHYLPREDNISOLONE ACETATE 40 MG/ML IJ SUSP
INTRAMUSCULAR | Status: AC
  Filled 2020-03-16: qty 1

## 2020-03-16 MED ORDER — LACTATED RINGERS IV SOLN: INTRAVENOUS | Status: DC

## 2020-03-16 MED ORDER — CLINDAMYCIN PHOSPHATE 900 MG/50ML IV SOLN
900.0000 mg | INTRAVENOUS | Status: AC
  Administered 2020-03-16: 900 mg via INTRAVENOUS

## 2020-03-16 MED ORDER — DEXAMETHASONE SODIUM PHOSPHATE 10 MG/ML IJ SOLN
INTRAMUSCULAR | Status: DC | PRN
  Administered 2020-03-16: 4 mg via INTRAVENOUS

## 2020-03-16 MED ORDER — BUPIVACAINE-EPINEPHRINE (PF) 0.25% -1:200000 IJ SOLN
INTRAMUSCULAR | Status: DC | PRN
  Administered 2020-03-16: 10 mL

## 2020-03-16 MED ORDER — ONDANSETRON HCL 4 MG/2ML IJ SOLN: 4.0000 mg | Freq: Four times a day (QID) | INTRAMUSCULAR | Status: DC | PRN

## 2020-03-16 MED ORDER — MEPERIDINE HCL 50 MG/ML IJ SOLN: 6.2500 mg | INTRAMUSCULAR | Status: DC | PRN

## 2020-03-16 MED ORDER — MIDAZOLAM HCL 2 MG/2ML IJ SOLN
INTRAMUSCULAR | Status: DC | PRN
  Administered 2020-03-16: 2 mg via INTRAVENOUS

## 2020-03-16 MED ORDER — ONDANSETRON HCL 4 MG/2ML IJ SOLN
INTRAMUSCULAR | Status: DC | PRN
  Administered 2020-03-16: 4 mg via INTRAVENOUS

## 2020-03-16 MED ORDER — ONDANSETRON HCL 4 MG PO TABS: 4.0000 mg | ORAL_TABLET | Freq: Four times a day (QID) | ORAL | Status: DC | PRN

## 2020-03-16 MED ORDER — OXYCODONE HCL 5 MG/5ML PO SOLN: 5.0000 mg | Freq: Once | ORAL | Status: AC | PRN

## 2020-03-16 MED ORDER — PROPOFOL 10 MG/ML IV BOLUS
INTRAVENOUS | Status: AC
  Filled 2020-03-16: qty 20

## 2020-03-16 SURGICAL SUPPLY — 35 items
ADAPTER IRRIG TUBE 2 SPIKE SOL (ADAPTER) IMPLANT
BLADE FULL RADIUS 3.5 (BLADE) IMPLANT
BLADE INCISOR PLUS 4.5 (BLADE) ×3 IMPLANT
BLADE SHAVER 4.5 DBL SERAT CV (CUTTER) ×3 IMPLANT
BLADE SURG SZ11 CARB STEEL (BLADE) ×3 IMPLANT
BRUSH SCRUB EZ  4% CHG (MISCELLANEOUS) ×4
BRUSH SCRUB EZ 4% CHG (MISCELLANEOUS) ×2 IMPLANT
CHLORAPREP W/TINT 26 (MISCELLANEOUS) ×3 IMPLANT
COOLER POLAR GLACIER W/PUMP (MISCELLANEOUS) ×3 IMPLANT
COVER WAND RF STERILE (DRAPES) ×3 IMPLANT
DRAPE SPLIT 6X30 W/TAPE (DRAPES) ×3 IMPLANT
GAUZE SPONGE 4X4 12PLY STRL (GAUZE/BANDAGES/DRESSINGS) ×3 IMPLANT
GAUZE XEROFORM 1X8 LF (GAUZE/BANDAGES/DRESSINGS) ×3 IMPLANT
GLOVE INDICATOR 8.0 STRL GRN (GLOVE) ×9 IMPLANT
GLOVE SURG ORTHO 8.0 STRL STRW (GLOVE) ×9 IMPLANT
GOWN STRL REUS W/ TWL LRG LVL3 (GOWN DISPOSABLE) ×1 IMPLANT
GOWN STRL REUS W/ TWL XL LVL3 (GOWN DISPOSABLE) ×1 IMPLANT
GOWN STRL REUS W/TWL LRG LVL3 (GOWN DISPOSABLE) ×2
GOWN STRL REUS W/TWL XL LVL3 (GOWN DISPOSABLE) ×2
IV LACTATED RINGER IRRG 3000ML (IV SOLUTION) ×8
IV LR IRRIG 3000ML ARTHROMATIC (IV SOLUTION) ×4 IMPLANT
KIT TURNOVER KIT A (KITS) ×3 IMPLANT
MANIFOLD NEPTUNE II (INSTRUMENTS) ×3 IMPLANT
MAT ABSORB  FLUID 56X50 GRAY (MISCELLANEOUS) ×2
MAT ABSORB FLUID 56X50 GRAY (MISCELLANEOUS) ×1 IMPLANT
NEEDLE SPNL 20GX3.5 QUINCKE YW (NEEDLE) ×3 IMPLANT
PACK KNEE ARTHRO (MISCELLANEOUS) ×3 IMPLANT
PAD ABD DERMACEA PRESS 5X9 (GAUZE/BANDAGES/DRESSINGS) ×6 IMPLANT
PAD WRAPON POLAR KNEE (MISCELLANEOUS) ×1 IMPLANT
SUT ETHILON 4-0 (SUTURE) ×2
SUT ETHILON 4-0 FS2 18XMFL BLK (SUTURE) ×1
SUTURE ETHLN 4-0 FS2 18XMF BLK (SUTURE) ×1 IMPLANT
TUBING ARTHRO INFLOW-ONLY STRL (TUBING) ×3 IMPLANT
WAND WEREWOLF FLOW 90D (MISCELLANEOUS) ×3 IMPLANT
WRAPON POLAR PAD KNEE (MISCELLANEOUS) ×3

## 2020-03-16 NOTE — Anesthesia Postprocedure Evaluation (Signed)
Anesthesia Post Note  Patient: TAURUS WILLIS  Procedure(s) Performed: Right knee arthroscopy with partial medial and lateral menisectomy, micro-fracture, partial synovectomy (Right Knee)  Patient location during evaluation: PACU Anesthesia Type: General Level of consciousness: awake and alert and oriented Pain management: pain level controlled Vital Signs Assessment: post-procedure vital signs reviewed and stable Respiratory status: spontaneous breathing, nonlabored ventilation and respiratory function stable Cardiovascular status: blood pressure returned to baseline and stable Postop Assessment: no signs of nausea or vomiting Anesthetic complications: no   No complications documented.   Last Vitals:  Vitals:   03/16/20 1430 03/16/20 1436  BP:  131/87  Pulse: 72 72  Resp: 13 19  Temp:    SpO2: 98% 96%    Last Pain:  Vitals:   03/16/20 1440  TempSrc:   PainSc: 5                  Lilya Smitherman

## 2020-03-16 NOTE — Op Note (Signed)
PATIENT:  Brandi Jordan  PRE-OPERATIVE DIAGNOSIS:  S83.206A Unsp tear of unsp meniscus, current injury, right knee, init  POST-OPERATIVE DIAGNOSIS:  Same  PROCEDURE:  Right knee arthroscopy with partial medial and lateral menisectomy, micro-fracture, partial synovectomy  SURGEON:  Kurtis Bushman, MD  ANESTHESIA:   General  PREOPERATIVE INDICATIONS:  Brandi Jordan  59 y.o. female with a diagnosis of S83.206A Unsp tear of unsp meniscus, current injury, right knee, init who failed conservative management and elected for surgical management.    The risks benefits and alternatives were discussed with the patient preoperatively including the risks of infection, bleeding, nerve injury, knee stiffness, persistent pain, osteoarthritis and the need for further surgery. Medical  risks include DVT and pulmonary embolism, myocardial infarction, stroke, pneumonia, respiratory failure and death. The patient understood these risks and wished to proceed.   OPERATIVE FINDINGS: The suprapatellar pouch, medial and lateral gutters were inspected and found to be multiple small less than 3 mm diameter loose bodies. The undersurface of the patella and landing zone were inspected and grade 4 chondromalacia was noted. There was no evidence of lateral subluxation. The medial compartment showed a complex tear of the posterior horn, then anterior horn was intact and stable to probing. Grade 4 focal defect 1.5 cm by 1 cm was identified on the anteromedial medial femoral condyle.  The notch was inspected and the ACL and PCL were intact and stable to probing. The lateral compartment was entered and minimal degenerative changes were identified. The lateral meniscus had significant degenerative tearing along the posterior horn. The anterior horn was intact a stable.  OPERATIVE PROCEDURE: Patient was met in the preoperative area. The operative extremity was signed with my initials according the hospital's correct site of surgery  protocol.  The patient was brought to the operating room where they was placed supine on the operative table. General anesthesia was administered. The patient was prepped and draped in a sterile fashion.  A timeout was performed to verify the patient's name, date of birth, medical record number, correct site of surgery correct procedure to be performed. It was also used to verify the patient received antibiotics that all appropriate instruments, and radiographic studies were available in the room. Once all in attendance were in agreement, the case began.  Proposed arthroscopy incisions were drawn out with a surgical marker. These were pre-injected with 0.5% marcaine with epinephrine. An 11 blade was used to establish an inferior lateral and inferomedial portals. The inferomedial portal was created using a 18-gauge spinal needle under direct visualization.  A full diagnostic examination of the knee was performed, please see findings for a complete list of results.  Patient had the meniscal tear treated with a 4-0 resector shaver blade and straight duckbill basket. The meniscus was debrided until a stable rim was achieved. A chondroplasty was performed in all three compartments using the shaver on reverse burr mode. A partial synovectomy was also performed in all three compartments using a 4-0 resector shaver blade and electrocautery.  Using the microfracture awl, a grid pattern of holes was created within the defect on the medial femoral condyle. Punctate bleeding was noted upon completion.  The knee was then copiously lavaged. All arthroscopic instruments were removed. The 2 arthroscopy portals were closed with 4-0 nylon. A dry sterile and compressive dressing was applied. The patient was brought to the PACU in stable condition. I was scrubbed and present for the entire case and all sharp and instrument counts were correct at the conclusion  the case. I spoke with the patient's family postoperatively to  let them know the case was performed without complication and the patient was stable in the recovery room.  Kurtis Bushman, MD

## 2020-03-16 NOTE — Discharge Instructions (Addendum)
Post Op Home Instructions for Knee Arthroscopy   YOU ARE WEIGHT BEARING AS TOLERATED PER MD ORDERS   1) Do not sit for longer than 1 hour at a time with your leg dangling down.  You should have your legs elevated (higher than your heart) in a recliner chair or couch.  2) You may be up walking around as tolerated but should take periodic breaks to elevate your legs.  Discontinue use of crutches when you feel you are able to walk without pain or a limp.  3) Work on gentle bending and straightening of the knee.  4) You may remove the Ace wrap and dressings two days after surgery.  Place band aids over the incision sites.  5) You may shower after you remove the surgical dressing.  You do not need to cover the incision with plastic wrap.  The incision can get wet, but do not submerge under water.  After your sutures have been removed, you should wait 24 hours before submerging incision under water.  AMBULATORY SURGERY  DISCHARGE INSTRUCTIONS   1) The drugs that you were given will stay in your system until tomorrow so for the next 24 hours you should not:  A) Drive an automobile B) Make any legal decisions C) Drink any alcoholic beverage   2) You may resume regular meals tomorrow.  Today it is better to start with liquids and gradually work up to solid foods.  You may eat anything you prefer, but it is better to start with liquids, then soup and crackers, and gradually work up to solid foods.   3) Please notify your doctor immediately if you have any unusual bleeding, trouble breathing, redness and pain at the surgery site, drainage, fever, or pain not relieved by medication.    4) Additional Instructions:        Please contact your physician with any problems or Same Day Surgery at [phone], Monday through Friday 6 am to 4 pm, or [place] at [facility] number at [phone].  6) Pain medication can cause constipation.  You should increase your fluid intake, increase  your intake of high fiber foods and/or take Metamucil as needed for constipation.  7) Continue your physical therapy exercises, as shown at the office, at least twice daily.  You should set up outpatient physical therapy and start within the first week after surgery.  8) Continue to use your Polar Pack continuously for 2-3 days after surgery.  After you remove the surgical dressing, it is a good idea to use your Polar Pack or ice pack for 30 minutes after doing your exercises to reduce swelling.  9) Do not be surprised if you have increased pain at night.  This usually means you have been a little too active during the day and need to reduce your activities.  10) If you develop lower extremity swelling that does not improve after a night of elevation, please call the office.  This could be an early sign of a blood clot.  Please call with any questions at [phone]

## 2020-03-16 NOTE — Anesthesia Procedure Notes (Signed)
Procedure Name: LMA Insertion Date/Time: 03/16/2020 12:27 PM Performed by: Jerrye Noble, CRNA Pre-anesthesia Checklist: Patient identified, Emergency Drugs available, Suction available and Patient being monitored Oxygen Delivery Method: Circle system utilized Preoxygenation: Pre-oxygenation with 100% oxygen Induction Type: IV induction LMA: LMA inserted LMA Size: 3.5 Number of attempts: 1 Placement Confirmation: positive ETCO2 and breath sounds checked- equal and bilateral Tube secured with: Tape Dental Injury: Teeth and Oropharynx as per pre-operative assessment

## 2020-03-16 NOTE — H&P (Signed)
The patient has been re-examined, and the chart reviewed, and there have been no interval changes to the documented history and physical.  Plan a right knee scope today.  Anesthesia is not consulted regarding a peripheral nerve block for post-operative pain.  The risks, benefits, and alternatives have been discussed at length, and the patient is willing to proceed.    

## 2020-03-16 NOTE — Anesthesia Preprocedure Evaluation (Signed)
Anesthesia Evaluation  Patient identified by MRN, date of birth, ID band Patient awake    Reviewed: Allergy & Precautions, NPO status , Patient's Chart, lab work & pertinent test results  History of Anesthesia Complications Negative for: history of anesthetic complications  Airway Mallampati: II  TM Distance: >3 FB Neck ROM: Full    Dental  (+) Missing   Pulmonary neg sleep apnea, neg COPD, former smoker,    breath sounds clear to auscultation- rhonchi (-) wheezing      Cardiovascular Exercise Tolerance: Good (-) hypertension(-) CAD, (-) Past MI, (-) Cardiac Stents and (-) CABG negative cardio ROS   Rhythm:Regular Rate:Normal - Systolic murmurs and - Diastolic murmurs    Neuro/Psych neg Seizures PSYCHIATRIC DISORDERS Anxiety negative neurological ROS     GI/Hepatic Neg liver ROS, hiatal hernia, GERD  ,  Endo/Other  diabetes, Insulin Dependent  Renal/GU negative Renal ROS     Musculoskeletal  (+) Arthritis ,   Abdominal (+) + obese,   Peds  Hematology negative hematology ROS (+)   Anesthesia Other Findings Past Medical History: No date: Anxiety No date: Arthritis No date: B12 deficiency No date: Dermatitis No date: Diabetes mellitus without complication (HCC) No date: Dyslipidemia No date: GERD (gastroesophageal reflux disease) No date: Knee pain No date: Overweight   Reproductive/Obstetrics                             Anesthesia Physical Anesthesia Plan  ASA: II  Anesthesia Plan: General   Post-op Pain Management:    Induction: Intravenous  PONV Risk Score and Plan: 2 and Ondansetron, Dexamethasone and Midazolam  Airway Management Planned: LMA  Additional Equipment:   Intra-op Plan:   Post-operative Plan:   Informed Consent: I have reviewed the patients History and Physical, chart, labs and discussed the procedure including the risks, benefits and alternatives for  the proposed anesthesia with the patient or authorized representative who has indicated his/her understanding and acceptance.     Dental advisory given  Plan Discussed with: CRNA and Anesthesiologist  Anesthesia Plan Comments:         Anesthesia Quick Evaluation

## 2020-03-16 NOTE — Transfer of Care (Signed)
Immediate Anesthesia Transfer of Care Note  Patient: Brandi Jordan  Procedure(s) Performed: Right knee arthroscopy with partial medial and lateral menisectomy, micro-fracture, partial synovectomy (Right Knee)  Patient Location: PACU  Anesthesia Type:General  Level of Consciousness: drowsy  Airway & Oxygen Therapy: Patient Spontanous Breathing and Patient connected to face mask oxygen  Post-op Assessment: Report given to RN and Post -op Vital signs reviewed and stable  Post vital signs: Reviewed and stable  Last Vitals:  Vitals Value Taken Time  BP 128/89 03/16/20 1336  Temp    Pulse 85 03/16/20 1338  Resp 13 03/16/20 1338  SpO2 100 % 03/16/20 1338  Vitals shown include unvalidated device data.  Last Pain:  Vitals:   03/16/20 1101  TempSrc: Oral  PainSc: 4          Complications: No complications documented.

## 2020-03-17 ENCOUNTER — Encounter: Payer: Self-pay | Admitting: Orthopedic Surgery

## 2020-07-17 ENCOUNTER — Other Ambulatory Visit: Payer: Self-pay | Admitting: Internal Medicine

## 2020-07-17 DIAGNOSIS — Z1231 Encounter for screening mammogram for malignant neoplasm of breast: Secondary | ICD-10-CM

## 2020-09-28 ENCOUNTER — Other Ambulatory Visit: Payer: Self-pay

## 2020-09-28 ENCOUNTER — Ambulatory Visit
Admission: RE | Admit: 2020-09-28 | Discharge: 2020-09-28 | Disposition: A | Discharge status: Home / Self Care | Payer: Managed Care, Other (non HMO) | Source: Ambulatory Visit | Attending: Internal Medicine | Admitting: Internal Medicine

## 2020-09-28 DIAGNOSIS — Z1231 Encounter for screening mammogram for malignant neoplasm of breast: Secondary | ICD-10-CM | POA: Diagnosis not present

## 2021-09-30 ENCOUNTER — Telehealth: Payer: Self-pay

## 2021-09-30 NOTE — Telephone Encounter (Signed)
CALLED PATIENT NO ANSWER LEFT VOICEMAIL FOR A CALL BACK ? ?

## 2021-09-30 NOTE — Telephone Encounter (Signed)
Pt would like to schedule repeat colonoscopy with Dr. Allen Norris

## 2021-10-01 ENCOUNTER — Telehealth: Payer: Self-pay

## 2021-10-01 ENCOUNTER — Other Ambulatory Visit: Payer: Self-pay

## 2021-10-01 DIAGNOSIS — Z86010 Personal history of colonic polyps: Secondary | ICD-10-CM

## 2021-10-01 MED ORDER — SUTAB 1479-225-188 MG PO TABS: 12.0000 | ORAL_TABLET | Freq: Once | ORAL | 0 refills | Status: AC

## 2021-10-01 MED ORDER — PEG 3350-KCL-NA BICARB-NACL 420 G PO SOLR: 4000.0000 mL | Freq: Once | ORAL | 0 refills | Status: AC

## 2021-10-01 MED ORDER — CLENPIQ 10-3.5-12 MG-GM -GM/160ML PO SOLN: 1.0000 | ORAL | 0 refills | Status: DC

## 2021-10-01 NOTE — Progress Notes (Signed)
Gastroenterology Pre-Procedure Review  Request Date: 10/18/2021 Requesting Physician: Dr. Allen Norris  PATIENT REVIEW QUESTIONS: The patient responded to the following health history questions as indicated:    1. Are you having any GI issues? no 2. Do you have a personal history of Polyps? yes (last colonoscopy) 3. Do you have a family history of Colon Cancer or Polyps? no 4. Diabetes Mellitus? yes (type 2) 5. Joint replacements in the past 12 months?yes (knee in July 2022) 6. Major health problems in the past 3 months?no 7. Any artificial heart valves, MVP, or defibrillator?no    MEDICATIONS & ALLERGIES:    Patient reports the following regarding taking any anticoagulation/antiplatelet therapy:   Plavix, Coumadin, Eliquis, Xarelto, Lovenox, Pradaxa, Brilinta, or Effient? no Aspirin? no  Patient confirms/reports the following medications:  Current Outpatient Medications  Medication Sig Dispense Refill   ALPRAZolam (XANAX) 0.5 MG tablet Take 1 tablet (0.5 mg total) by mouth daily as needed. (Patient taking differently: Take 0.5 mg by mouth at bedtime. ) 60 tablet 0   atorvastatin (LIPITOR) 40 MG tablet TAKE 1 TABLET BY MOUTH  DAILY (Patient taking differently: Take 40 mg by mouth every evening. ) 90 tablet 1   HUMALOG KWIKPEN 100 UNIT/ML KwikPen Inject 10-60 Units into the skin 3 (three) times daily before meals.     Insulin Pen Needle (BD PEN NEEDLE NANO U/F) 32G X 4 MM MISC Four times daily 200 each 2   LANTUS SOLOSTAR 100 UNIT/ML Solostar Pen Inject 50 Units into the skin daily. (Patient taking differently: Inject 75 Units into the skin daily. ) 15 mL 0   losartan (COZAAR) 25 MG tablet Take 25 mg by mouth daily.     naproxen sodium (ALEVE) 220 MG tablet Take 440 mg by mouth 2 (two) times daily as needed (pain.).     ONETOUCH VERIO test strip CHECK  BLOOD SUGAR TWICE  DAILY (Patient taking differently: 1 each by Other route 2 (two) times a day. ) 200 each 5   pantoprazole (PROTONIX) 40 MG  tablet Take 1 tablet (40 mg total) by mouth daily. **PLEASE SCHEDULE FOLLOW UP APPT** (Patient taking differently: Take 40 mg by mouth at bedtime. ) 90 tablet 0   Soft Lens Products (REFRESH CONTACTS DROPS) SOLN Apply 1 drop to eye 3 (three) times daily as needed (dry/irritated eyes.).     No current facility-administered medications for this visit.    Patient confirms/reports the following allergies:  Allergies  Allergen Reactions   Azithromycin Nausea Only    Sweating    Bydureon [Exenatide] Nausea And Vomiting   Effexor Xr [Venlafaxine Hcl Er] Nausea And Vomiting and Other (See Comments)    "felt weird"   [place] [Dapagliflozin] Other (See Comments)    headache   Invokana [Canagliflozin] Other (See Comments)    Vaginitis/UTI   Kombiglyze [Saxagliptin-Metformin Er] Diarrhea and Other (See Comments)    dizziness   Lovaza [Omega-3-Acid Ethyl Esters]     diarrhea   Other Other (See Comments) and Nausea Only    Diabetic Medications   Amoxicillin Rash    No orders of the defined types were placed in this encounter.   AUTHORIZATION INFORMATION Primary Insurance: 1D#: Group #:  Secondary Insurance: 1D#: Group #:  SCHEDULE INFORMATION: Date: 10/18/2021 Time: Location:[facility]

## 2021-10-01 NOTE — Telephone Encounter (Signed)
PATIENT CALLED AND WANTED DIFFERENT PREP SENT IN SO SENT IN DIFFERENT PREP

## 2021-10-01 NOTE — Telephone Encounter (Signed)
CALLED PATIENT NO ANSWER LEFT VOICEMAIL FOR A CALL BACK ? ?

## 2021-10-05 ENCOUNTER — Telehealth: Payer: Self-pay

## 2021-10-05 NOTE — Telephone Encounter (Signed)
Patient called again and has some questions about her colonoscopy

## 2021-10-05 NOTE — Telephone Encounter (Signed)
Patient left a voicemail confirmed when her colonoscopy

## 2021-10-06 ENCOUNTER — Telehealth: Payer: Self-pay

## 2021-10-06 NOTE — Telephone Encounter (Signed)
Called patient back and answered all her prep questions

## 2021-10-06 NOTE — Telephone Encounter (Signed)
Patient called back and patient needs a call back

## 2021-10-06 NOTE — Telephone Encounter (Signed)
CALLED PATIENT NO ANSWER LEFT VOICEMAIL FOR A CALL BACK ? ?

## 2021-10-12 ENCOUNTER — Encounter: Payer: Self-pay | Admitting: Gastroenterology

## 2021-10-18 ENCOUNTER — Ambulatory Visit: Payer: Managed Care, Other (non HMO) | Admitting: Anesthesiology

## 2021-10-18 ENCOUNTER — Encounter: Payer: Self-pay | Admitting: Gastroenterology

## 2021-10-18 ENCOUNTER — Other Ambulatory Visit: Payer: Self-pay

## 2021-10-18 ENCOUNTER — Ambulatory Visit
Admission: RE | Admit: 2021-10-18 | Discharge: 2021-10-18 | Disposition: A | Discharge status: Home / Self Care | Payer: Managed Care, Other (non HMO) | Attending: Gastroenterology | Admitting: Gastroenterology

## 2021-10-18 ENCOUNTER — Encounter
Admission: RE | Disposition: A | Discharge status: Home / Self Care | Payer: Self-pay | Source: Home / Self Care | Attending: Gastroenterology

## 2021-10-18 DIAGNOSIS — K5730 Diverticulosis of large intestine without perforation or abscess without bleeding: Secondary | ICD-10-CM | POA: Insufficient documentation

## 2021-10-18 DIAGNOSIS — K449 Diaphragmatic hernia without obstruction or gangrene: Secondary | ICD-10-CM | POA: Insufficient documentation

## 2021-10-18 DIAGNOSIS — Z1211 Encounter for screening for malignant neoplasm of colon: Secondary | ICD-10-CM | POA: Insufficient documentation

## 2021-10-18 DIAGNOSIS — K640 First degree hemorrhoids: Secondary | ICD-10-CM | POA: Diagnosis not present

## 2021-10-18 DIAGNOSIS — E1165 Type 2 diabetes mellitus with hyperglycemia: Secondary | ICD-10-CM | POA: Insufficient documentation

## 2021-10-18 DIAGNOSIS — Z87891 Personal history of nicotine dependence: Secondary | ICD-10-CM | POA: Insufficient documentation

## 2021-10-18 DIAGNOSIS — Z86010 Personal history of colonic polyps: Secondary | ICD-10-CM | POA: Diagnosis not present

## 2021-10-18 DIAGNOSIS — K635 Polyp of colon: Secondary | ICD-10-CM

## 2021-10-18 DIAGNOSIS — K219 Gastro-esophageal reflux disease without esophagitis: Secondary | ICD-10-CM | POA: Diagnosis not present

## 2021-10-18 DIAGNOSIS — I10 Essential (primary) hypertension: Secondary | ICD-10-CM | POA: Diagnosis not present

## 2021-10-18 DIAGNOSIS — D122 Benign neoplasm of ascending colon: Secondary | ICD-10-CM | POA: Diagnosis not present

## 2021-10-18 HISTORY — DX: Essential (primary) hypertension: I10

## 2021-10-18 HISTORY — DX: Presence of dental prosthetic device (complete) (partial): Z97.2

## 2021-10-18 HISTORY — PX: COLONOSCOPY WITH PROPOFOL: SHX5780

## 2021-10-18 HISTORY — PX: POLYPECTOMY: SHX5525

## 2021-10-18 HISTORY — DX: Presence of spectacles and contact lenses: Z97.3

## 2021-10-18 LAB — GLUCOSE, CAPILLARY
Glucose-Capillary: 99 mg/dL (ref 70–99)
Glucose-Capillary: 93 mg/dL (ref 70–99)

## 2021-10-18 SURGERY — COLONOSCOPY WITH PROPOFOL
Anesthesia: General | Site: Rectum

## 2021-10-18 MED ORDER — LACTATED RINGERS IV SOLN: INTRAVENOUS | Status: DC

## 2021-10-18 MED ORDER — LIDOCAINE HCL (CARDIAC) PF 100 MG/5ML IV SOSY
PREFILLED_SYRINGE | INTRAVENOUS | Status: DC | PRN
  Administered 2021-10-18: 30 mg via INTRAVENOUS

## 2021-10-18 MED ORDER — PROPOFOL 10 MG/ML IV BOLUS
INTRAVENOUS | Status: DC | PRN
  Administered 2021-10-18 (×2): 40 mg via INTRAVENOUS
  Administered 2021-10-18: 100 mg via INTRAVENOUS
  Administered 2021-10-18: 50 mg via INTRAVENOUS
  Administered 2021-10-18: 40 mg via INTRAVENOUS

## 2021-10-18 MED ORDER — STERILE WATER FOR IRRIGATION IR SOLN
Status: DC | PRN
  Administered 2021-10-18: 1

## 2021-10-18 MED ORDER — SODIUM CHLORIDE 0.9 % IV SOLN: INTRAVENOUS | Status: DC

## 2021-10-18 SURGICAL SUPPLY — 7 items
FORCEPS BIOP RAD 4 LRG CAP 4 (CUTTING FORCEPS) ×1 IMPLANT
GOWN CVR UNV OPN BCK APRN NK (MISCELLANEOUS) ×2 IMPLANT
GOWN ISOL THUMB LOOP REG UNIV (MISCELLANEOUS) ×4
KIT PRC NS LF DISP ENDO (KITS) ×1 IMPLANT
KIT PROCEDURE OLYMPUS (KITS) ×2
MANIFOLD NEPTUNE II (INSTRUMENTS) ×2 IMPLANT
WATER STERILE IRR 250ML POUR (IV SOLUTION) ×2 IMPLANT

## 2021-10-18 NOTE — Transfer of Care (Signed)
Immediate Anesthesia Transfer of Care Note  Patient: Brandi Jordan  Procedure(s) Performed: COLONOSCOPY WITH BIOPSY (Rectum) POLYPECTOMY (Rectum)  Patient Location: PACU  Anesthesia Type: General  Level of Consciousness: awake, alert  and patient cooperative  Airway and Oxygen Therapy: Patient Spontanous Breathing and Patient connected to supplemental oxygen  Post-op Assessment: Post-op Vital signs reviewed, Patient's Cardiovascular Status Stable, Respiratory Function Stable, Patent Airway and No signs of Nausea or vomiting  Post-op Vital Signs: Reviewed and stable  Complications: No notable events documented.

## 2021-10-18 NOTE — Anesthesia Postprocedure Evaluation (Signed)
Anesthesia Post Note  Patient: Brandi Jordan  Procedure(s) Performed: COLONOSCOPY WITH BIOPSY (Rectum) POLYPECTOMY (Rectum)     Patient location during evaluation: PACU Anesthesia Type: General Level of consciousness: awake and alert Pain management: pain level controlled Vital Signs Assessment: post-procedure vital signs reviewed and stable Respiratory status: spontaneous breathing, nonlabored ventilation, respiratory function stable and patient connected to nasal cannula oxygen Cardiovascular status: blood pressure returned to baseline and stable Postop Assessment: no apparent nausea or vomiting Anesthetic complications: no   No notable events documented.  Trecia Rogers

## 2021-10-18 NOTE — Op Note (Signed)
[facility] Gastroenterology Patient Name: Brandi Jordan Procedure Date: 10/18/2021 8:08 AM MRN: [id number] Account #: [id number] Date of Birth: 1961-03-10 Admit Type: Outpatient Age: 61 Room: [facility] OR ROOM 01 Gender: Female Note Status: Finalized Instrument Name: [id number] Procedure:             Colonoscopy Indications:           High risk colon cancer surveillance: Personal history                         of colonic polyps Providers:             Lucilla Lame MD, MD Referring MD:          Leonie Douglas. Doy Hutching, MD (Referring MD) Medicines:             Propofol per Anesthesia Complications:         No immediate complications. Procedure:             Pre-Anesthesia Assessment:                        - Prior to the procedure, a History and Physical was                         performed, and patient medications and allergies were                         reviewed. The patient's tolerance of previous                         anesthesia was also reviewed. The risks and benefits                         of the procedure and the sedation options and risks                         were discussed with the patient. All questions were                         answered, and informed consent was obtained. Prior                         Anticoagulants: The patient has taken no previous                         anticoagulant or antiplatelet agents. ASA Grade                         Assessment: II - A patient with mild systemic disease.                         After reviewing the risks and benefits, the patient                         was deemed in satisfactory condition to undergo the                         procedure.  After obtaining informed consent, the colonoscope was                         passed under direct vision. Throughout the procedure,                         the patient's blood pressure, pulse, and oxygen                         saturations were monitored  continuously. The                         Colonoscope was introduced through the anus and                         advanced to the the cecum, identified by appendiceal                         orifice and ileocecal valve. The colonoscopy was                         performed without difficulty. The patient tolerated                         the procedure well. The quality of the bowel                         preparation was excellent. Findings:      The perianal and digital rectal examinations were normal.      A 3 mm polyp was found in the ascending colon. The polyp was sessile.       The polyp was removed with a cold biopsy forceps. Resection and       retrieval were complete.      A few small-mouthed diverticula were found in the entire colon.      Non-bleeding internal hemorrhoids were found during retroflexion. The       hemorrhoids were Grade I (internal hemorrhoids that do not prolapse). Impression:            - One 3 mm polyp in the ascending colon, removed with                         a cold biopsy forceps. Resected and retrieved.                        - Diverticulosis in the entire examined colon.                        - Non-bleeding internal hemorrhoids. Recommendation:        - Discharge patient to home.                        - Resume previous diet.                        - Continue present medications.                        - Await pathology results.                        -  Repeat colonoscopy in 7 years for surveillance. Procedure Code(s):     --- Professional ---                        [phone], Colonoscopy, flexible; with biopsy, single or                         multiple Diagnosis Code(s):     --- Professional ---                        Z86.010, Personal history of colonic polyps                        K63.5, Polyp of colon CPT copyright 2019 American Medical Association. All rights reserved. The codes documented in this report are preliminary and upon coder review may   be revised to meet current compliance requirements. Lucilla Lame MD, MD 10/18/2021 8:33:35 AM This report has been signed electronically. Number of Addenda: 0 Note Initiated On: 10/18/2021 8:08 AM Scope Withdrawal Time: 0 hours 8 minutes 24 seconds  Total Procedure Duration: 0 hours 10 minutes 45 seconds  Estimated Blood Loss:  Estimated blood loss: none.      [facility]

## 2021-10-18 NOTE — H&P (Signed)
Lucilla Lame, MD [address] [place], [place] [id number] Phone:[phone] Fax : [phone]  Primary Care Physician:  Idelle Crouch, MD Primary Gastroenterologist:  Dr. Allen Norris  Pre-Procedure History & Physical: HPI:  Brandi Jordan is a 61 y.o. female is here for an colonoscopy.   Past Medical History:  Diagnosis Date   Anxiety    Arthritis    B12 deficiency    Dermatitis    Diabetes mellitus without complication (HCC)    Dyslipidemia    GERD (gastroesophageal reflux disease)    Hypertension    Knee pain    Overweight    Wears contact lenses    sometimes   Wears dentures    partial upper    Past Surgical History:  Procedure Laterality Date   ABDOMINAL HYSTERECTOMY     BREAST BIOPSY Right    rt cyst removed   BUNIONECTOMY Bilateral    9 years apart   COLONOSCOPY  09/01/2010   Iftikhar-diverticulosis only   COLONOSCOPY WITH PROPOFOL N/A 03/03/2015   Procedure: COLONOSCOPY WITH PROPOFOL;  Surgeon: Lucilla Lame, MD;  Location: [facility] ENDOSCOPY;  Service: Endoscopy;  Laterality: N/A;   ESOPHAGOGASTRODUODENOSCOPY (EGD) WITH PROPOFOL N/A 03/03/2015   Procedure: ESOPHAGOGASTRODUODENOSCOPY (EGD) WITH PROPOFOL;  Surgeon: Lucilla Lame, MD;  Location: [facility] ENDOSCOPY;  Service: Endoscopy;  Laterality: N/A;   KNEE ARTHROSCOPY WITH MEDIAL MENISECTOMY Right 03/16/2020   Procedure: Right knee arthroscopy with partial medial and lateral menisectomy, micro-fracture, partial synovectomy;  Surgeon: Lovell Sheehan, MD;  Location: [facility] ORS;  Service: Orthopedics;  Laterality: Right;   PARTIAL HYSTERECTOMY  1990   Partial    Prior to Admission medications   Medication Sig Start Date End Date Taking? Authorizing Provider  ALPRAZolam Duanne Moron) 0.5 MG tablet Take 1 tablet (0.5 mg total) by mouth daily as needed. Patient taking differently: Take 0.5 mg by mouth at bedtime. 12/23/16  Yes Sowles, Drue Stager, MD  atorvastatin (LIPITOR) 40 MG tablet TAKE 1 TABLET BY MOUTH  DAILY Patient  taking differently: Take 40 mg by mouth every evening. 01/09/18  Yes Sowles, Drue Stager, MD  Dulaglutide (TRULICITY) 1.5 GB/2.0FE SOPN Inject 1.5 mg into the skin once a week.   Yes [provider]  LANTUS SOLOSTAR 100 UNIT/ML Solostar Pen Inject 50 Units into the skin daily. Patient taking differently: Inject 80 Units into the skin daily. 12/23/16  Yes Sowles, Drue Stager, MD  losartan (COZAAR) 25 MG tablet Take 25 mg by mouth daily. 11/12/18  Yes [provider]  naproxen sodium (ALEVE) 220 MG tablet Take 440 mg by mouth 2 (two) times daily as needed (pain.).   Yes [provider]  pantoprazole (PROTONIX) 40 MG tablet Take 1 tablet (40 mg total) by mouth daily. **PLEASE SCHEDULE FOLLOW UP APPT** Patient taking differently: Take 40 mg by mouth at bedtime. 02/05/19  Yes Lucilla Lame, MD  Soft Lens Products (REFRESH CONTACTS DROPS) SOLN Apply 1 drop to eye 3 (three) times daily as needed (dry/irritated eyes.).   Yes [provider]  HUMALOG KWIKPEN 100 UNIT/ML KwikPen Inject 10-60 Units into the skin 3 (three) times daily before meals. Patient not taking: Reported on 10/12/2021 10/01/19   [provider]  Insulin Pen Needle (BD PEN NEEDLE NANO U/F) 32G X 4 MM MISC Four times daily 12/23/16   Steele Sizer, MD  [facility] VERIO test strip CHECK  BLOOD SUGAR TWICE  DAILY Patient taking differently: 1 each by Other route 2 (two) times a day.  10/05/16   Sowles,  Drue Stager, MD  Sod Picosulfate-Mag Ox-Cit Acd (CLENPIQ) 10-3.5-12 MG-GM -GM/160ML SOLN Take 1 kit by mouth as directed. At 5 PM evening before procedure, drink 1 bottle of Clenpiq, hydrate, drink (5) 8 oz of water. Then do the same thing 5 hours prior to your procedure. 10/01/21   Lucilla Lame, MD    Allergies as of 10/01/2021 - Review Complete 10/01/2021  Allergen Reaction Noted   Azithromycin Nausea Only 02/13/2019   Bydureon [exenatide] Nausea And Vomiting 06/30/2015   Effexor xr [venlafaxine hcl er] Nausea And  Vomiting and Other (See Comments) 02/02/2015   Wilder Glade [dapagliflozin] Other (See Comments) 02/02/2015   Invokana [canagliflozin] Other (See Comments) 02/02/2015   Kombiglyze [saxagliptin-metformin er] Diarrhea and Other (See Comments) 02/02/2015   Lovaza [omega-3-acid ethyl esters]  12/23/2016   Other Other (See Comments) and Nausea Only 06/22/2015   Amoxicillin Rash 01/29/2015    Family History  Problem Relation Age of Onset   Cholecystitis Mother    Kidney failure Mother    Hypothyroidism Mother    Diabetes Mother    Lung cancer Father    Leukemia Sister        chronic lymphocytic   Diabetes Sister    Colon cancer Maternal Aunt 28   Hypothyroidism Daughter    Breast cancer Maternal Aunt    Diabetes Brother    Cancer Sister 34       breast cancer, also has leukemia   Liver disease Neg Hx     Social History   Socioeconomic History   Marital status: Married    Spouse name: Not on file   Number of children: 1   Years of education: Not on file   Highest education level: Not on file  Occupational History   Occupation: customer service    Employer: [employer]  Tobacco Use   Smoking status: Former    Packs/day: 1.00    Years: 35.00    Pack years: 35.00    Types: Cigarettes    Start date: 08/29/1974    Quit date: 06/03/2010    Years since quitting: 11.3   Smokeless tobacco: Never  Vaping Use   Vaping Use: Never used  Substance and Sexual Activity   Alcohol use: No    Alcohol/week: 0.0 standard drinks   Drug use: No   Sexual activity: Not Currently  Other Topics Concern   Not on file  Social History Narrative   Lives with husband, 1 healthy grown daughter, works in Therapist, art at [employer] Strain: Not on file  Food Insecurity: Not on file  Transportation Needs: Not on file  Physical Activity: Not on file  Stress: Not on file  Social Connections: Not on file  Intimate Partner Violence: Not on file     Review of Systems: See HPI, otherwise negative ROS  Physical Exam: BP 133/78    Pulse 90    Temp 97.6 F (36.4 C) (Temporal)    Resp 20    Ht _0  (1.626 m)    Wt 73.9 kg    SpO2 98%    BMI 27.98 kg/m  General:   Alert,  pleasant and cooperative in NAD Head:  Normocephalic and atraumatic. Neck:  Supple; no masses or thyromegaly. Lungs:  Clear throughout to auscultation.    Heart:  Regular rate and rhythm. Abdomen:  Soft, nontender and nondistended. Normal bowel sounds, without guarding, and without rebound.   Neurologic:  Alert and  oriented x4;  grossly normal neurologically.  Impression/Plan: Brandi Jordan is here for an colonoscopy to be performed for a history of adenomatous polyps on 2016    Risks, benefits, limitations, and alternatives regarding  colonoscopy have been reviewed with the patient.  Questions have been answered.  All parties agreeable.   Lucilla Lame, MD  10/18/2021, 7:39 AM

## 2021-10-18 NOTE — Anesthesia Preprocedure Evaluation (Signed)
Anesthesia Evaluation  Patient identified by MRN, date of birth, ID band Patient awake    Reviewed: Allergy & Precautions, H&P , NPO status , Patient's Chart, lab work & pertinent test results, reviewed documented beta blocker date and time   Airway Mallampati: III  TM Distance: >3 FB Neck ROM: full    Dental no notable dental hx.    Pulmonary neg pulmonary ROS, former smoker,    Pulmonary exam normal breath sounds clear to auscultation       Cardiovascular Exercise Tolerance: Good hypertension, Normal cardiovascular exam Rhythm:regular Rate:Normal     Neuro/Psych negative neurological ROS  negative psych ROS   GI/Hepatic Neg liver ROS, hiatal hernia, GERD  ,  Endo/Other  diabetes, Poorly Controlled, Type 2  Renal/GU negative Renal ROS  negative genitourinary   Musculoskeletal   Abdominal   Peds  Hematology negative hematology ROS (+)   Anesthesia Other Findings   Reproductive/Obstetrics negative OB ROS                             Anesthesia Physical Anesthesia Plan  ASA: 2  Anesthesia Plan: General   Post-op Pain Management:    Induction:   PONV Risk Score and Plan:   Airway Management Planned:   Additional Equipment:   Intra-op Plan:   Post-operative Plan:   Informed Consent: I have reviewed the patients History and Physical, chart, labs and discussed the procedure including the risks, benefits and alternatives for the proposed anesthesia with the patient or authorized representative who has indicated his/her understanding and acceptance.     Dental Advisory Given  Plan Discussed with: CRNA and Anesthesiologist  Anesthesia Plan Comments:         Anesthesia Quick Evaluation

## 2021-10-19 ENCOUNTER — Encounter: Payer: Self-pay | Admitting: Gastroenterology

## 2021-10-19 LAB — SURGICAL PATHOLOGY

## 2021-10-21 ENCOUNTER — Encounter: Payer: Self-pay | Admitting: Gastroenterology

## 2021-11-22 ENCOUNTER — Other Ambulatory Visit: Payer: Self-pay | Admitting: Internal Medicine

## 2021-11-22 DIAGNOSIS — Z1231 Encounter for screening mammogram for malignant neoplasm of breast: Secondary | ICD-10-CM

## 2021-11-25 ENCOUNTER — Ambulatory Visit
Admission: RE | Admit: 2021-11-25 | Discharge: 2021-11-25 | Disposition: A | Discharge status: Home / Self Care | Payer: Managed Care, Other (non HMO) | Source: Ambulatory Visit | Attending: Internal Medicine | Admitting: Internal Medicine

## 2021-11-25 DIAGNOSIS — Z1231 Encounter for screening mammogram for malignant neoplasm of breast: Secondary | ICD-10-CM | POA: Insufficient documentation

## 2021-12-08 ENCOUNTER — Other Ambulatory Visit: Payer: Self-pay | Admitting: Internal Medicine

## 2021-12-08 DIAGNOSIS — R0602 Shortness of breath: Secondary | ICD-10-CM

## 2021-12-08 DIAGNOSIS — I208 Other forms of angina pectoris: Secondary | ICD-10-CM

## 2021-12-10 ENCOUNTER — Telehealth (HOSPITAL_COMMUNITY): Payer: Self-pay | Admitting: Emergency Medicine

## 2021-12-10 DIAGNOSIS — R079 Chest pain, unspecified: Secondary | ICD-10-CM

## 2021-12-10 MED ORDER — METOPROLOL TARTRATE 100 MG PO TABS: 100.0000 mg | ORAL_TABLET | Freq: Once | ORAL | 0 refills | Status: DC

## 2021-12-10 NOTE — Telephone Encounter (Signed)
Reaching out to patient to offer assistance regarding upcoming cardiac imaging study; pt verbalizes understanding of appt date/time, parking situation and where to check in, pre-test NPO status and medications ordered, and verified current allergies; name and call back number provided for further questions should they arise ?Marchia Bond RN Navigator Cardiac Imaging ?[facility] ?[phone] office ?[phone] cell ? ?'100mg'$  metoprolol tartrate ?Difficult IV ?Arrival 745 ?

## 2021-12-13 ENCOUNTER — Ambulatory Visit
Admission: RE | Admit: 2021-12-13 | Discharge: 2021-12-13 | Disposition: A | Discharge status: Home / Self Care | Payer: Managed Care, Other (non HMO) | Source: Ambulatory Visit | Attending: Internal Medicine | Admitting: Internal Medicine

## 2021-12-13 DIAGNOSIS — I208 Other forms of angina pectoris: Secondary | ICD-10-CM | POA: Insufficient documentation

## 2021-12-13 DIAGNOSIS — R0602 Shortness of breath: Secondary | ICD-10-CM | POA: Insufficient documentation

## 2021-12-13 LAB — POCT I-STAT CREATININE: Creatinine, Ser: 0.6 mg/dL (ref 0.44–1.00)

## 2021-12-13 MED ORDER — IOHEXOL 350 MG/ML SOLN
75.0000 mL | Freq: Once | INTRAVENOUS | Status: AC | PRN
  Administered 2021-12-13: 75 mL via INTRAVENOUS

## 2021-12-13 MED ORDER — NITROGLYCERIN 0.4 MG SL SUBL
0.4000 mg | SUBLINGUAL_TABLET | Freq: Once | SUBLINGUAL | Status: AC
  Administered 2021-12-13: 0.4 mg via SUBLINGUAL

## 2021-12-13 MED ORDER — METOPROLOL TARTRATE 5 MG/5ML IV SOLN
10.0000 mg | Freq: Once | INTRAVENOUS | Status: AC
  Administered 2021-12-13: 10 mg via INTRAVENOUS

## 2021-12-13 MED ORDER — METOPROLOL TARTRATE 5 MG/5ML IV SOLN: 10.0000 mg | Freq: Once | INTRAVENOUS | Status: DC

## 2021-12-13 MED ORDER — NITROGLYCERIN 0.4 MG SL SUBL
0.8000 mg | SUBLINGUAL_TABLET | Freq: Once | SUBLINGUAL | Status: AC
  Administered 2021-12-13: 0.8 mg via SUBLINGUAL

## 2021-12-13 MED ORDER — METOPROLOL TARTRATE 5 MG/5ML IV SOLN
10.0000 mg | Freq: Once | INTRAVENOUS | Status: AC
Start: 2021-12-13 — End: 2021-12-13
  Administered 2021-12-13: 10 mg via INTRAVENOUS

## 2021-12-13 MED ORDER — DILTIAZEM HCL 25 MG/5ML IV SOLN
10.0000 mg | Freq: Once | INTRAVENOUS | Status: AC
Start: 2021-12-13 — End: 2021-12-13
  Administered 2021-12-13: 10 mg via INTRAVENOUS

## 2021-12-13 NOTE — Progress Notes (Signed)
This RN spoke with Dr Mylo Red regarding pt's heart rate at 67-71 despite '20mg'$  of metoprolol and '10mg'$  of cardiazem and pt being outside of the nitro window, pt given 1 sl nitro additionally and per dr Melvyn Neth order another dose of '10mg'$  of metoprolol iv and ok to scan the pt at 65 heart rate. ?

## 2021-12-13 NOTE — Progress Notes (Signed)
Patient tolerated procedure well. Ambulate w/o difficulty. Denies any lightheadedness or being dizzy. Pt denies any pain at this time. Sitting in chair, drinking water provided. P is encouraged to drink additional water throughout the day and reason explained to patient. Patient verbalized understanding and all questions answered. ABC intact. No further needs at this time. Discharge from procedure area w/o issues.  °

## 2022-11-03 ENCOUNTER — Other Ambulatory Visit: Payer: Self-pay | Admitting: Internal Medicine

## 2022-11-03 DIAGNOSIS — Z1231 Encounter for screening mammogram for malignant neoplasm of breast: Secondary | ICD-10-CM

## 2023-01-31 ENCOUNTER — Ambulatory Visit
Admission: RE | Admit: 2023-01-31 | Discharge: 2023-01-31 | Disposition: A | Discharge status: Home / Self Care | Payer: Managed Care, Other (non HMO) | Source: Ambulatory Visit | Attending: Internal Medicine | Admitting: Internal Medicine

## 2023-01-31 DIAGNOSIS — Z1231 Encounter for screening mammogram for malignant neoplasm of breast: Secondary | ICD-10-CM

## 2023-03-01 ENCOUNTER — Other Ambulatory Visit: Payer: Self-pay | Admitting: Internal Medicine

## 2023-03-01 DIAGNOSIS — R519 Headache, unspecified: Secondary | ICD-10-CM

## 2023-03-01 DIAGNOSIS — R270 Ataxia, unspecified: Secondary | ICD-10-CM

## 2023-03-11 ENCOUNTER — Other Ambulatory Visit: Payer: Managed Care, Other (non HMO)

## 2023-03-25 ENCOUNTER — Ambulatory Visit
Admission: RE | Admit: 2023-03-25 | Discharge: 2023-03-25 | Disposition: A | Discharge status: Home / Self Care | Payer: Managed Care, Other (non HMO) | Source: Ambulatory Visit | Attending: Internal Medicine | Admitting: Internal Medicine

## 2023-03-25 DIAGNOSIS — R270 Ataxia, unspecified: Secondary | ICD-10-CM

## 2023-03-25 DIAGNOSIS — R519 Headache, unspecified: Secondary | ICD-10-CM

## 2023-03-29 NOTE — Progress Notes (Unsigned)
Referring Physician:  Marguarite Arbour, MD [address] [facility] [place],  [place] [address]  Primary Physician:  Marguarite Arbour, MD  History of Present Illness: 04/06/2023 Brandi Jordan is here today with a chief complaint of meningioma.  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  She states that she has had headaches for considerable amount of time, however they are starting to worsen.  She is also recently lost her husband approximately 2 months ago which has increased her level of stress.  She has been having trouble with her short-term memory and ability to concentrate.  She has tried some medical management for headaches including Fioricet and NSAIDs.  In regards to the meningioma, she had no prior history.  No family history.  I have utilized the care everywhere function in epic to review the outside records available from external health systems.  Review of Systems:  A 10 point review of systems is negative, except for the pertinent positives and negatives detailed in the HPI.  Past Medical History: Past Medical History:  Diagnosis Date   Anxiety    Arthritis    B12 deficiency    Dermatitis    Diabetes mellitus without complication (HCC)    Dyslipidemia    GERD (gastroesophageal reflux disease)    Hypertension    Knee pain    Overweight    Wears contact lenses    sometimes   Wears dentures    partial upper    Past Surgical History: Past Surgical History:  Procedure Laterality Date   ABDOMINAL HYSTERECTOMY     BREAST BIOPSY Right    rt cyst removed   BUNIONECTOMY Bilateral    9 years apart   COLONOSCOPY  09/01/2010   Iftikhar-diverticulosis only   COLONOSCOPY WITH PROPOFOL N/A 03/03/2015   Procedure: COLONOSCOPY WITH PROPOFOL;  Surgeon: Midge Minium, MD;  Location: [facility];  Service: Endoscopy;  Laterality: N/A;    COLONOSCOPY WITH PROPOFOL N/A 10/18/2021   Procedure: COLONOSCOPY WITH BIOPSY;  Surgeon: Midge Minium, MD;  Location: [facility];  Service: Endoscopy;  Laterality: N/A;  Diabetic   ESOPHAGOGASTRODUODENOSCOPY (EGD) WITH PROPOFOL N/A 03/03/2015   Procedure: ESOPHAGOGASTRODUODENOSCOPY (EGD) WITH PROPOFOL;  Surgeon: Midge Minium, MD;  Location: [facility];  Service: Endoscopy;  Laterality: N/A;   KNEE ARTHROSCOPY WITH MEDIAL MENISECTOMY Right 03/16/2020   Procedure: Right knee arthroscopy with partial medial and lateral menisectomy, micro-fracture, partial synovectomy;  Surgeon: Lyndle Herrlich, MD;  Location: [facility];  Service: Orthopedics;  Laterality: Right;   PARTIAL HYSTERECTOMY  1990   Partial   POLYPECTOMY N/A 10/18/2021   Procedure: POLYPECTOMY;  Surgeon: Midge Minium, MD;  Location: [facility];  Service: Endoscopy;  Laterality: N/A;    Allergies: Allergies as of 04/06/2023 - Review Complete 04/06/2023  Allergen Reaction Noted   Azithromycin Nausea Only 02/13/2019   Bydureon [exenatide] Nausea And Vomiting 06/30/2015   Effexor xr [venlafaxine hcl er] Nausea And Vomiting and Other (See Comments) 02/02/2015   Farxiga [dapagliflozin] Other (See Comments) 02/02/2015   Invokana [canagliflozin] Other (See Comments) 02/02/2015   Kombiglyze [saxagliptin-metformin er] Diarrhea and Other (See Comments) 02/02/2015   Lovaza [omega-3-acid ethyl esters]  12/23/2016   Other Other (See Comments) and Nausea Only 06/22/2015   Amoxicillin Rash 01/29/2015    Medications:  Current Outpatient Medications:    butalbital-acetaminophen-caffeine (FIORICET) 50-325-40 MG tablet,  Take by mouth., Disp: , Rfl:    diazepam (VALIUM) 10 MG tablet, Take 1 tablet by mouth at bedtime as needed., Disp: , Rfl:    famotidine (PEPCID) 40 MG tablet, , Disp: , Rfl:    glucose blood (PRECISION QID TEST) test strip, Use 4 (four) times daily Use as instructed., Disp: , Rfl:    Semaglutide,0.25 or  0.5MG /DOS, 2 MG/3ML SOPN, Inject into the skin., Disp: , Rfl:    sertraline (ZOLOFT) 100 MG tablet, , Disp: , Rfl:    topiramate (TOPAMAX) 50 MG tablet, Take 50 mg by mouth 2 (two) times daily., Disp: , Rfl:    acetaminophen-codeine (TYLENOL #3) 300-30 MG tablet, TAKE 1 TO 2 TABLETS EVERY 4 TO 6 HOURS AS NEEDED FOR PAIN, Disp: , Rfl:    ALPRAZolam (XANAX) 0.5 MG tablet, Take 1 tablet (0.5 mg total) by mouth daily as needed. (Patient taking differently: Take 0.5 mg by mouth at bedtime.), Disp: 60 tablet, Rfl: 0   atorvastatin (LIPITOR) 40 MG tablet, TAKE 1 TABLET BY MOUTH  DAILY (Patient taking differently: Take 40 mg by mouth every evening.), Disp: 90 tablet, Rfl: 1   HUMALOG KWIKPEN 100 UNIT/ML KwikPen, Inject 10-60 Units into the skin 3 (three) times daily before meals. (Patient not taking: Reported on 10/12/2021), Disp: , Rfl:    ibuprofen (ADVIL) 600 MG tablet, Take 1 tablet by mouth every 6 (six) hours as needed., Disp: , Rfl:    Insulin Pen Needle (BD PEN NEEDLE NANO U/F) 32G X 4 MM MISC, Four times daily, Disp: 200 each, Rfl: 2   LANTUS SOLOSTAR 100 UNIT/ML Solostar Pen, Inject 50 Units into the skin daily. (Patient taking differently: Inject 80 Units into the skin daily.), Disp: 15 mL, Rfl: 0   losartan (COZAAR) 25 MG tablet, Take 25 mg by mouth daily., Disp: , Rfl:    methocarbamol (ROBAXIN) 500 MG tablet, methocarbamol 500 mg tablet  TAKE 1 TABLET BY MOUTH THREE TIMES DAILY FOR SPASMS, Disp: , Rfl:    metoprolol tartrate (LOPRESSOR) 100 MG tablet, Take 1 tablet (100 mg total) by mouth once for 1 dose. Please take one time dose 100mg  metoprolol tartrate 2 hr prior to cardiac CT for HR control IF HR >55bpm., Disp: 1 tablet, Rfl: 0   naproxen sodium (ALEVE) 220 MG tablet, Take 440 mg by mouth 2 (two) times daily as needed (pain.)., Disp: , Rfl:    omeprazole-sodium bicarbonate (ZEGERID) 40-1100 MG capsule, TAKE 1 CAPSULE BY MOUTH EVERY DAY BEFORE BREAKFAST, Disp: , Rfl:    ONETOUCH VERIO test  strip, CHECK  BLOOD SUGAR TWICE  DAILY (Patient taking differently: 1 each by Other route 2 (two) times a day. ), Disp: 200 each, Rfl: 5   pantoprazole (PROTONIX) 40 MG tablet, Take 1 tablet (40 mg total) by mouth daily. **PLEASE SCHEDULE FOLLOW UP APPT** (Patient taking differently: Take 40 mg by mouth at bedtime.), Disp: 90 tablet, Rfl: 0   Sod Picosulfate-Mag Ox-Cit Acd (CLENPIQ) 10-3.5-12 MG-GM -GM/160ML SOLN, Take 1 kit by mouth as directed. At 5 PM evening before procedure, drink 1 bottle of Clenpiq, hydrate, drink (5) 8 oz of water. Then do the same thing 5 hours prior to your procedure., Disp: 320 mL, Rfl: 0   Soft Lens Products (REFRESH CONTACTS DROPS) SOLN, Apply 1 drop to eye 3 (three) times daily as needed (dry/irritated eyes.)., Disp: , Rfl:   Social History: Social History   Tobacco Use   Smoking status: Former    Current packs/day:  0.00    Average packs/day: 1 pack/day for 35.8 years (35.8 ttl pk-yrs)    Types: Cigarettes    Start date: 08/29/1974    Quit date: 06/03/2010    Years since quitting: 12.8   Smokeless tobacco: Never  Vaping Use   Vaping status: Never Used  Substance Use Topics   Alcohol use: No    Alcohol/week: 0.0 standard drinks of alcohol   Drug use: No    Family Medical History: Family History  Problem Relation Age of Onset   Cholecystitis Mother    Kidney failure Mother    Hypothyroidism Mother    Diabetes Mother    Lung cancer Father    Leukemia Sister        chronic lymphocytic   Diabetes Sister    Colon cancer Maternal Aunt 59   Hypothyroidism Daughter    Breast cancer Maternal Aunt    Diabetes Brother    Cancer Sister 33       breast cancer, also has leukemia   Liver disease Neg Hx     Physical Examination: Vitals:   04/06/23 0921  BP: 115/60    General: Patient is in no apparent distress. Attention to examination is appropriate.  Neck:   Supple.  Full range of motion.  Respiratory: Patient is breathing without any  difficulty.   NEUROLOGICAL:     Awake, alert, oriented to person, place, and time.  Speech is clear and fluent.   Cranial Nerves: Pupils equal round and reactive to light.  Facial tone is symmetric.  Facial sensation is symmetric. Shoulder shrug is symmetric. Tongue protrusion is midline.    Strength: Full strength in the bilateral upper and lower extremities no evidence of pronator drift.  Reflexes are 2+ and symmetric at the biceps, triceps, brachioradialis, patella and achilles.   Hoffman's is absent. Clonus is absent  Bilateral upper and lower extremity sensation is intact to light touch.     No evidence of dysmetria noted.  Gait is normal.    Imaging: Narrative & Impression  CLINICAL DATA:  Provided history: Ataxia. Headache disorder. Additional history provided by the scanning technologist: The patient reports headaches and balance difficulty.   EXAM: MRI HEAD WITHOUT CONTRAST   TECHNIQUE: Multiplanar, multiecho pulse sequences of the brain and surrounding structures were obtained without intravenous contrast.   COMPARISON:  None.   FINDINGS: Brain:   No age advanced or lobar predominant parenchymal atrophy.   7 mm dural-based mass overlying the high right parietal lobe likely reflecting a small meningioma (for instance as seen on series 12, image 24) (series 105, image 12). Contact upon the underlying brain parenchyma without underlying parenchymal edema.   There are a few small nonspecific foci of T2 FLAIR hyperintense signal abnormality scattered within the bilateral cerebral white matter.   2 mm chronic microhemorrhage within the right parietal lobe (series 16, image 37).   No cortical encephalomalacia is identified.   There is no acute infarct.   No extra-axial fluid collection.   No midline shift.   Vascular: Maintained flow voids within the proximal large arterial vessels.   Skull and upper cervical spine: No suspicious marrow lesion.    Sinuses/Orbits: No mass or acute finding within the imaged orbits. No significant paranasal sinus disease.   Impression #1 will be called to the ordering clinician or representative by the Radiologist Assistant, and communication documented in the PACS or [employer].   IMPRESSION: 1. 7 mm dural-based mass overlying the high right parietal lobe  most consistent with a meningioma. Contact upon the underlying brain parenchyma without underlying parenchymal edema. 2. 2 mm chronic microhemorrhage within the right parietal lobe. 3. There are a few small nonspecific T2 FLAIR hyperintense chronic insults within the cerebral white matter. 4. Otherwise unremarkable non-contrast MRI appearance of the brain.     Electronically Signed   By: Jackey Loge D.O.   On: 03/26/2023 10:54   I have personally reviewed the images and agree with the above interpretation.  Medical Decision Making/Assessment and Plan: Brandi Jordan is a pleasant 62 y.o. female with a recent diagnosis of meningioma while being worked up for headaches.  She has had severe frequent daily headaches for many months that have recently worsened.  As part of her workup she underwent an MRI which demonstrated this meningioma which is small 7 mm.  She does complain of headaches, memory loss, postural instability, confusion.  On physical exam I did not identify any focal neurologic deficits.  Meningioma [facility]) For her meningioma, this is unlikely to be the cause of her presenting symptoms and rather found incidentally.  It is quite small at 7 mm.  We do not have interval imaging, we like to follow-up with a MRI with and without contrast in approximately 6 months to evaluate for interval growth.  Memory loss Frequent headaches Patient states that she is having worsening memory loss and increased frequency of her headaches.  She is currently being evaluated by her primary care physician for the headaches.  She has tried Fioricet as well  as some NSAID medications.  She feels that she is not getting any significant control.  Could consider a referral to headache specialist or general neurologist given her cognitive issues as well.  Notably she has had the recent loss of her husband which she has had to deal with over the past 2 months which could have exacerbated some of her symptoms.  Thank you for involving me in the care of this patient.    Lovenia Kim MD/MSCR Neurosurgery

## 2023-04-06 ENCOUNTER — Ambulatory Visit: Payer: Managed Care, Other (non HMO) | Admitting: Neurosurgery

## 2023-04-06 ENCOUNTER — Encounter: Payer: Self-pay | Admitting: Neurosurgery

## 2023-04-06 VITALS — BP 115/60 | Ht 64.0 in | Wt 161.8 lb

## 2023-04-06 DIAGNOSIS — R519 Headache, unspecified: Secondary | ICD-10-CM

## 2023-04-06 DIAGNOSIS — D329 Benign neoplasm of meninges, unspecified: Secondary | ICD-10-CM | POA: Diagnosis not present

## 2023-04-06 DIAGNOSIS — R413 Other amnesia: Secondary | ICD-10-CM | POA: Diagnosis not present

## 2023-04-07 ENCOUNTER — Other Ambulatory Visit: Payer: Self-pay

## 2023-09-29 ENCOUNTER — Other Ambulatory Visit: Payer: Self-pay

## 2023-09-29 DIAGNOSIS — S32012A Unstable burst fracture of first lumbar vertebra, initial encounter for closed fracture: Principal | ICD-10-CM | POA: Diagnosis present

## 2023-09-29 DIAGNOSIS — M1990 Unspecified osteoarthritis, unspecified site: Secondary | ICD-10-CM | POA: Diagnosis present

## 2023-09-29 DIAGNOSIS — Z881 Allergy status to other antibiotic agents status: Secondary | ICD-10-CM

## 2023-09-29 DIAGNOSIS — Z880 Allergy status to penicillin: Secondary | ICD-10-CM

## 2023-09-29 DIAGNOSIS — Z794 Long term (current) use of insulin: Secondary | ICD-10-CM

## 2023-09-29 DIAGNOSIS — E1165 Type 2 diabetes mellitus with hyperglycemia: Secondary | ICD-10-CM | POA: Diagnosis present

## 2023-09-29 DIAGNOSIS — D72829 Elevated white blood cell count, unspecified: Secondary | ICD-10-CM | POA: Diagnosis not present

## 2023-09-29 DIAGNOSIS — Z888 Allergy status to other drugs, medicaments and biological substances status: Secondary | ICD-10-CM

## 2023-09-29 DIAGNOSIS — E663 Overweight: Secondary | ICD-10-CM | POA: Diagnosis present

## 2023-09-29 DIAGNOSIS — S34101A Unspecified injury to L1 level of lumbar spinal cord, initial encounter: Principal | ICD-10-CM | POA: Diagnosis present

## 2023-09-29 DIAGNOSIS — S0003XA Contusion of scalp, initial encounter: Secondary | ICD-10-CM | POA: Diagnosis present

## 2023-09-29 DIAGNOSIS — E1140 Type 2 diabetes mellitus with diabetic neuropathy, unspecified: Secondary | ICD-10-CM | POA: Diagnosis present

## 2023-09-29 DIAGNOSIS — K219 Gastro-esophageal reflux disease without esophagitis: Secondary | ICD-10-CM | POA: Diagnosis present

## 2023-09-29 DIAGNOSIS — Z803 Family history of malignant neoplasm of breast: Secondary | ICD-10-CM

## 2023-09-29 DIAGNOSIS — Z841 Family history of disorders of kidney and ureter: Secondary | ICD-10-CM

## 2023-09-29 DIAGNOSIS — Z981 Arthrodesis status: Secondary | ICD-10-CM

## 2023-09-29 DIAGNOSIS — K5900 Constipation, unspecified: Secondary | ICD-10-CM | POA: Diagnosis not present

## 2023-09-29 DIAGNOSIS — Z800 Family history of malignant neoplasm of digestive organs: Secondary | ICD-10-CM

## 2023-09-29 DIAGNOSIS — Z87891 Personal history of nicotine dependence: Secondary | ICD-10-CM

## 2023-09-29 DIAGNOSIS — L299 Pruritus, unspecified: Secondary | ICD-10-CM | POA: Diagnosis not present

## 2023-09-29 DIAGNOSIS — Z79899 Other long term (current) drug therapy: Secondary | ICD-10-CM

## 2023-09-29 DIAGNOSIS — I10 Essential (primary) hypertension: Secondary | ICD-10-CM | POA: Diagnosis present

## 2023-09-29 DIAGNOSIS — E785 Hyperlipidemia, unspecified: Secondary | ICD-10-CM | POA: Diagnosis present

## 2023-09-29 DIAGNOSIS — Z806 Family history of leukemia: Secondary | ICD-10-CM

## 2023-09-29 DIAGNOSIS — Z7985 Long-term (current) use of injectable non-insulin antidiabetic drugs: Secondary | ICD-10-CM

## 2023-09-29 DIAGNOSIS — Z801 Family history of malignant neoplasm of trachea, bronchus and lung: Secondary | ICD-10-CM

## 2023-09-29 DIAGNOSIS — S32000A Wedge compression fracture of unspecified lumbar vertebra, initial encounter for closed fracture: Secondary | ICD-10-CM | POA: Diagnosis not present

## 2023-09-29 DIAGNOSIS — G992 Myelopathy in diseases classified elsewhere: Secondary | ICD-10-CM | POA: Diagnosis present

## 2023-09-29 DIAGNOSIS — Z6827 Body mass index (BMI) 27.0-27.9, adult: Secondary | ICD-10-CM

## 2023-09-29 DIAGNOSIS — Y92410 Unspecified street and highway as the place of occurrence of the external cause: Secondary | ICD-10-CM

## 2023-09-29 DIAGNOSIS — R339 Retention of urine, unspecified: Secondary | ICD-10-CM | POA: Diagnosis not present

## 2023-09-29 DIAGNOSIS — Z833 Family history of diabetes mellitus: Secondary | ICD-10-CM

## 2023-09-29 DIAGNOSIS — F411 Generalized anxiety disorder: Secondary | ICD-10-CM | POA: Diagnosis present
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
## 2023-09-29 NOTE — ED Triage Notes (Signed)
Patient C/O lower back and bilateral hip pain-see previous note

## 2023-09-30 ENCOUNTER — Encounter: Payer: Self-pay | Admitting: Radiology

## 2023-09-30 ENCOUNTER — Inpatient Hospital Stay: Payer: Managed Care, Other (non HMO)

## 2023-09-30 ENCOUNTER — Inpatient Hospital Stay
Admission: EM | Admit: 2023-09-30 | Discharge: 2023-10-07 | DRG: 029 | Disposition: A | Discharge status: Skilled Nursing Facility | Payer: Managed Care, Other (non HMO) | Attending: Internal Medicine | Admitting: Internal Medicine

## 2023-09-30 ENCOUNTER — Emergency Department: Payer: Managed Care, Other (non HMO)

## 2023-09-30 DIAGNOSIS — S32012A Unstable burst fracture of first lumbar vertebra, initial encounter for closed fracture: Secondary | ICD-10-CM | POA: Diagnosis present

## 2023-09-30 DIAGNOSIS — K219 Gastro-esophageal reflux disease without esophagitis: Secondary | ICD-10-CM

## 2023-09-30 DIAGNOSIS — D329 Benign neoplasm of meninges, unspecified: Secondary | ICD-10-CM

## 2023-09-30 DIAGNOSIS — E663 Overweight: Secondary | ICD-10-CM | POA: Diagnosis present

## 2023-09-30 DIAGNOSIS — E785 Hyperlipidemia, unspecified: Secondary | ICD-10-CM | POA: Diagnosis present

## 2023-09-30 DIAGNOSIS — Z794 Long term (current) use of insulin: Secondary | ICD-10-CM | POA: Diagnosis not present

## 2023-09-30 DIAGNOSIS — R338 Other retention of urine: Secondary | ICD-10-CM | POA: Diagnosis not present

## 2023-09-30 DIAGNOSIS — Z800 Family history of malignant neoplasm of digestive organs: Secondary | ICD-10-CM | POA: Diagnosis not present

## 2023-09-30 DIAGNOSIS — S32000A Wedge compression fracture of unspecified lumbar vertebra, initial encounter for closed fracture: Secondary | ICD-10-CM | POA: Diagnosis present

## 2023-09-30 DIAGNOSIS — M48061 Spinal stenosis, lumbar region without neurogenic claudication: Secondary | ICD-10-CM

## 2023-09-30 DIAGNOSIS — Z419 Encounter for procedure for purposes other than remedying health state, unspecified: Secondary | ICD-10-CM

## 2023-09-30 DIAGNOSIS — E1165 Type 2 diabetes mellitus with hyperglycemia: Secondary | ICD-10-CM | POA: Diagnosis present

## 2023-09-30 DIAGNOSIS — G9520 Unspecified cord compression: Secondary | ICD-10-CM

## 2023-09-30 DIAGNOSIS — S34101A Unspecified injury to L1 level of lumbar spinal cord, initial encounter: Secondary | ICD-10-CM | POA: Diagnosis present

## 2023-09-30 DIAGNOSIS — R52 Pain, unspecified: Secondary | ICD-10-CM | POA: Diagnosis not present

## 2023-09-30 DIAGNOSIS — S32009A Unspecified fracture of unspecified lumbar vertebra, initial encounter for closed fracture: Secondary | ICD-10-CM

## 2023-09-30 DIAGNOSIS — G992 Myelopathy in diseases classified elsewhere: Secondary | ICD-10-CM | POA: Diagnosis present

## 2023-09-30 DIAGNOSIS — E1169 Type 2 diabetes mellitus with other specified complication: Secondary | ICD-10-CM | POA: Diagnosis not present

## 2023-09-30 DIAGNOSIS — Z6827 Body mass index (BMI) 27.0-27.9, adult: Secondary | ICD-10-CM | POA: Diagnosis not present

## 2023-09-30 DIAGNOSIS — S0003XA Contusion of scalp, initial encounter: Secondary | ICD-10-CM | POA: Diagnosis present

## 2023-09-30 DIAGNOSIS — F411 Generalized anxiety disorder: Secondary | ICD-10-CM | POA: Diagnosis present

## 2023-09-30 DIAGNOSIS — K5900 Constipation, unspecified: Secondary | ICD-10-CM | POA: Diagnosis not present

## 2023-09-30 DIAGNOSIS — R339 Retention of urine, unspecified: Secondary | ICD-10-CM | POA: Diagnosis not present

## 2023-09-30 DIAGNOSIS — Z833 Family history of diabetes mellitus: Secondary | ICD-10-CM | POA: Diagnosis not present

## 2023-09-30 DIAGNOSIS — M1990 Unspecified osteoarthritis, unspecified site: Secondary | ICD-10-CM | POA: Diagnosis present

## 2023-09-30 DIAGNOSIS — E119 Type 2 diabetes mellitus without complications: Secondary | ICD-10-CM

## 2023-09-30 DIAGNOSIS — Z888 Allergy status to other drugs, medicaments and biological substances status: Secondary | ICD-10-CM | POA: Diagnosis not present

## 2023-09-30 DIAGNOSIS — Z801 Family history of malignant neoplasm of trachea, bronchus and lung: Secondary | ICD-10-CM | POA: Diagnosis not present

## 2023-09-30 DIAGNOSIS — Z881 Allergy status to other antibiotic agents status: Secondary | ICD-10-CM | POA: Diagnosis not present

## 2023-09-30 DIAGNOSIS — I10 Essential (primary) hypertension: Secondary | ICD-10-CM | POA: Diagnosis present

## 2023-09-30 DIAGNOSIS — D72829 Elevated white blood cell count, unspecified: Secondary | ICD-10-CM | POA: Diagnosis not present

## 2023-09-30 DIAGNOSIS — Y92410 Unspecified street and highway as the place of occurrence of the external cause: Secondary | ICD-10-CM | POA: Diagnosis not present

## 2023-09-30 DIAGNOSIS — Z87891 Personal history of nicotine dependence: Secondary | ICD-10-CM | POA: Diagnosis not present

## 2023-09-30 DIAGNOSIS — L299 Pruritus, unspecified: Secondary | ICD-10-CM | POA: Diagnosis not present

## 2023-09-30 DIAGNOSIS — E1140 Type 2 diabetes mellitus with diabetic neuropathy, unspecified: Secondary | ICD-10-CM | POA: Diagnosis present

## 2023-09-30 DIAGNOSIS — E782 Mixed hyperlipidemia: Secondary | ICD-10-CM | POA: Diagnosis not present

## 2023-09-30 LAB — CBC WITH DIFFERENTIAL/PLATELET
Abs Immature Granulocytes: 0.05 10*3/uL (ref 0.00–0.07)
Basophils Absolute: 0 10*3/uL (ref 0.0–0.1)
Basophils Relative: 0 %
Eosinophils Absolute: 0.1 10*3/uL (ref 0.0–0.5)
Eosinophils Relative: 0 %
HCT: 38.4 % (ref 36.0–46.0)
Hemoglobin: 12.6 g/dL (ref 12.0–15.0)
Immature Granulocytes: 0 %
Lymphocytes Relative: 14 %
Lymphs Abs: 1.7 10*3/uL (ref 0.7–4.0)
MCH: 27 pg (ref 26.0–34.0)
MCHC: 32.8 g/dL (ref 30.0–36.0)
MCV: 82.2 fL (ref 80.0–100.0)
Monocytes Absolute: 0.8 10*3/uL (ref 0.1–1.0)
Monocytes Relative: 7 %
Neutro Abs: 9.4 10*3/uL — ABNORMAL HIGH (ref 1.7–7.7)
Neutrophils Relative %: 79 %
Platelets: 239 10*3/uL (ref 150–400)
RBC: 4.67 MIL/uL (ref 3.87–5.11)
RDW: 14.7 % (ref 11.5–15.5)
WBC: 12 10*3/uL — ABNORMAL HIGH (ref 4.0–10.5)
nRBC: 0 % (ref 0.0–0.2)

## 2023-09-30 LAB — TYPE AND SCREEN
ABO/RH(D): O POS
Antibody Screen: NEGATIVE

## 2023-09-30 LAB — BASIC METABOLIC PANEL
Anion gap: 10 (ref 5–15)
BUN: 15 mg/dL (ref 8–23)
CO2: 24 mmol/L (ref 22–32)
Calcium: 9 mg/dL (ref 8.9–10.3)
Chloride: 105 mmol/L (ref 98–111)
Creatinine, Ser: 0.55 mg/dL (ref 0.44–1.00)
GFR, Estimated: >60 mL/min (ref >60)
Glucose, Bld: 111 mg/dL — ABNORMAL HIGH (ref 70–99)
Potassium: 3.9 mmol/L (ref 3.5–5.1)
Sodium: 139 mmol/L (ref 135–145)

## 2023-09-30 LAB — HEMOGLOBIN A1C
Hgb A1c MFr Bld: 7.1 % — ABNORMAL HIGH (ref 4.8–5.6)
Mean Plasma Glucose: 157.07 mg/dL

## 2023-09-30 LAB — APTT: aPTT: 37 s — ABNORMAL HIGH (ref 24–36)

## 2023-09-30 LAB — PROTIME-INR
INR: 1.1 (ref 0.8–1.2)
Prothrombin Time: 14.1 s (ref 11.4–15.2)

## 2023-09-30 LAB — HIV ANTIBODY (ROUTINE TESTING W REFLEX): HIV Screen 4th Generation wRfx: NONREACTIVE

## 2023-09-30 LAB — CBG MONITORING, ED
Glucose-Capillary: 95 mg/dL (ref 70–99)
Glucose-Capillary: 66 mg/dL — ABNORMAL LOW (ref 70–99)

## 2023-09-30 MED ORDER — ONDANSETRON HCL 4 MG/2ML IJ SOLN
4.0000 mg | INTRAMUSCULAR | Status: AC
  Administered 2023-09-30: 4 mg via INTRAVENOUS
  Filled 2023-09-30: qty 2

## 2023-09-30 MED ORDER — ONDANSETRON HCL 4 MG PO TABS: 4.0000 mg | ORAL_TABLET | Freq: Four times a day (QID) | ORAL | Status: DC | PRN

## 2023-09-30 MED ORDER — MORPHINE SULFATE (PF) 4 MG/ML IV SOLN
4.0000 mg | Freq: Once | INTRAVENOUS | Status: AC | PRN
  Administered 2023-09-30: 4 mg via INTRAVENOUS
  Filled 2023-09-30: qty 1

## 2023-09-30 MED ORDER — LIDOCAINE 5 % EX PTCH
1.0000 | MEDICATED_PATCH | CUTANEOUS | Status: DC
  Administered 2023-09-30 – 2023-10-07 (×7): 1 via TRANSDERMAL
  Filled 2023-09-30 (×7): qty 1

## 2023-09-30 MED ORDER — ONDANSETRON HCL 4 MG/2ML IJ SOLN
4.0000 mg | Freq: Four times a day (QID) | INTRAMUSCULAR | Status: DC | PRN
  Administered 2023-09-30 – 2023-10-02 (×2): 4 mg via INTRAVENOUS
  Filled 2023-09-30 (×2): qty 2

## 2023-09-30 MED ORDER — MORPHINE SULFATE (PF) 4 MG/ML IV SOLN
4.0000 mg | Freq: Once | INTRAVENOUS | Status: AC
  Administered 2023-09-30: 4 mg via INTRAVENOUS
  Filled 2023-09-30: qty 1

## 2023-09-30 MED ORDER — KETOROLAC TROMETHAMINE 30 MG/ML IJ SOLN
30.0000 mg | Freq: Once | INTRAMUSCULAR | Status: AC
  Administered 2023-09-30: 30 mg via INTRAMUSCULAR
  Filled 2023-09-30: qty 1

## 2023-09-30 MED ORDER — MORPHINE SULFATE (PF) 4 MG/ML IV SOLN
4.0000 mg | INTRAVENOUS | Status: DC | PRN
  Administered 2023-09-30 – 2023-10-02 (×8): 4 mg via INTRAVENOUS
  Filled 2023-09-30 (×8): qty 1

## 2023-09-30 MED ORDER — ENOXAPARIN SODIUM 40 MG/0.4ML IJ SOSY
40.0000 mg | PREFILLED_SYRINGE | INTRAMUSCULAR | Status: DC
  Administered 2023-09-30: 40 mg via SUBCUTANEOUS
  Filled 2023-09-30: qty 0.4

## 2023-09-30 MED ORDER — OXYCODONE-ACETAMINOPHEN 5-325 MG PO TABS
2.0000 | ORAL_TABLET | Freq: Once | ORAL | Status: AC
  Administered 2023-09-30: 2 via ORAL
  Filled 2023-09-30: qty 2

## 2023-09-30 MED ORDER — ACETAMINOPHEN 325 MG PO TABS
650.0000 mg | ORAL_TABLET | Freq: Four times a day (QID) | ORAL | Status: DC | PRN
  Administered 2023-09-30 – 2023-10-02 (×2): 650 mg via ORAL
  Filled 2023-09-30 (×2): qty 2

## 2023-09-30 MED ORDER — INSULIN ASPART 100 UNIT/ML IJ SOLN
0.0000 [IU] | Freq: Three times a day (TID) | INTRAMUSCULAR | Status: DC
  Administered 2023-10-01: 3 [IU] via SUBCUTANEOUS
  Administered 2023-10-02: 2 [IU] via SUBCUTANEOUS
  Administered 2023-10-02: 1 [IU] via SUBCUTANEOUS
  Administered 2023-10-02: 5 [IU] via SUBCUTANEOUS
  Administered 2023-10-03: 1 [IU] via SUBCUTANEOUS
  Administered 2023-10-03: 2 [IU] via SUBCUTANEOUS
  Administered 2023-10-03: 3 [IU] via SUBCUTANEOUS
  Administered 2023-10-04: 2 [IU] via SUBCUTANEOUS
  Filled 2023-09-30 (×8): qty 1

## 2023-09-30 NOTE — ED Notes (Signed)
Per neurosurgeon: HOLD LOVENOX (did receive AM dose) and NPO AFTER MIDNIGHT.

## 2023-09-30 NOTE — ED Notes (Signed)
PT at bedside. Pt still unsure if she wants the morning morphine dose, wants to see how she does moving with PT.

## 2023-09-30 NOTE — ED Notes (Signed)
Neurosurgeon at bedside discussing plan of care with pt. Surgery tomorrow morning.

## 2023-09-30 NOTE — Progress Notes (Signed)
OT Cancellation Note  Patient Details Name: VONNA BRABSON MRN: [id number] DOB: 1960-11-06   Cancelled Treatment:    Reason Eval/Treat Not Completed: Other (comment). Orders received, chart reviewed. Pt to have surgery tomorrow AM per neurosurgery note. OT will continue to follow and eval when appropriate upon continuation of current/or new OT orders.    [employer] L. Benett Swoyer, OTR/L  09/30/23, 12:21 PM

## 2023-09-30 NOTE — ED Notes (Signed)
Called [facility] tech Britney placed the order for the TLSO brace, tech stated she would put the order in and the brace would be dropped of.
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
## 2023-09-30 NOTE — Progress Notes (Signed)
Orthopedic Tech Progress Note Patient Details:  Brandi Jordan 15-Jan-1961 [id number]  Patient ID: Brandi Jordan, female   DOB: August 24, 1961, 63 y.o.   MRN: [id number] TLSO ordered from [facility]. Darleen Crocker 09/30/2023, 3:38 AM

## 2023-09-30 NOTE — Assessment & Plan Note (Signed)
 Cont statin

## 2023-09-30 NOTE — ED Notes (Signed)
 Pt to MRI via stretcher.
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
## 2023-09-30 NOTE — ED Notes (Signed)
Pt had c/o h/a--Dr Alvester Morin made aware with request of tylenol PRN for pt. Order received and medication was given accordingly.

## 2023-09-30 NOTE — ED Notes (Addendum)
Checked on pt again, granddaughter and brother both at bedside. Pt sitting up eating breakfast, pt states pain is okay right now and does not yet need the 4mg  morphine that was ordered for 0730 am.

## 2023-09-30 NOTE — Assessment & Plan Note (Signed)
Blood sugar 100s  SSI  A1c  Monitor
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
## 2023-09-30 NOTE — Assessment & Plan Note (Signed)
 Stable  Cont zoloft

## 2023-10-01 ENCOUNTER — Inpatient Hospital Stay: Payer: Managed Care, Other (non HMO)

## 2023-10-01 ENCOUNTER — Encounter
Admission: EM | Disposition: A | Discharge status: Skilled Nursing Facility | Payer: Self-pay | Source: Home / Self Care | Attending: Family Medicine

## 2023-10-01 ENCOUNTER — Inpatient Hospital Stay: Payer: Managed Care, Other (non HMO) | Admitting: Anesthesiology

## 2023-10-01 ENCOUNTER — Other Ambulatory Visit: Payer: Self-pay

## 2023-10-01 DIAGNOSIS — K219 Gastro-esophageal reflux disease without esophagitis: Secondary | ICD-10-CM

## 2023-10-01 DIAGNOSIS — S32009A Unspecified fracture of unspecified lumbar vertebra, initial encounter for closed fracture: Secondary | ICD-10-CM

## 2023-10-01 DIAGNOSIS — G9520 Unspecified cord compression: Secondary | ICD-10-CM

## 2023-10-01 DIAGNOSIS — S32000A Wedge compression fracture of unspecified lumbar vertebra, initial encounter for closed fracture: Secondary | ICD-10-CM | POA: Diagnosis not present

## 2023-10-01 DIAGNOSIS — S32012A Unstable burst fracture of first lumbar vertebra, initial encounter for closed fracture: Secondary | ICD-10-CM | POA: Diagnosis not present

## 2023-10-01 DIAGNOSIS — F411 Generalized anxiety disorder: Secondary | ICD-10-CM | POA: Diagnosis not present

## 2023-10-01 DIAGNOSIS — E782 Mixed hyperlipidemia: Secondary | ICD-10-CM

## 2023-10-01 DIAGNOSIS — E119 Type 2 diabetes mellitus without complications: Secondary | ICD-10-CM

## 2023-10-01 DIAGNOSIS — I10 Essential (primary) hypertension: Secondary | ICD-10-CM

## 2023-10-01 DIAGNOSIS — M48061 Spinal stenosis, lumbar region without neurogenic claudication: Secondary | ICD-10-CM | POA: Diagnosis not present

## 2023-10-01 HISTORY — PX: APPLICATION OF WOUND VAC: SHX5189

## 2023-10-01 HISTORY — PX: DECOMPRESSIVE LUMBAR LAMINECTOMY LEVEL 1: SHX5791

## 2023-10-01 LAB — GLUCOSE, CAPILLARY
Glucose-Capillary: 189 mg/dL — ABNORMAL HIGH (ref 70–99)
Glucose-Capillary: 230 mg/dL — ABNORMAL HIGH (ref 70–99)
Glucose-Capillary: 236 mg/dL — ABNORMAL HIGH (ref 70–99)

## 2023-10-01 LAB — CBC
HCT: 44.6 % (ref 36.0–46.0)
Hemoglobin: 14.4 g/dL (ref 12.0–15.0)
MCH: 27.2 pg (ref 26.0–34.0)
MCHC: 32.3 g/dL (ref 30.0–36.0)
MCV: 84.2 fL (ref 80.0–100.0)
Platelets: 225 10*3/uL (ref 150–400)
RBC: 5.3 MIL/uL — ABNORMAL HIGH (ref 3.87–5.11)
RDW: 15.2 % (ref 11.5–15.5)
WBC: 9.4 10*3/uL (ref 4.0–10.5)
nRBC: 0 % (ref 0.0–0.2)

## 2023-10-01 LAB — COMPREHENSIVE METABOLIC PANEL
ALT: 23 U/L (ref 0–44)
AST: 24 U/L (ref 15–41)
Albumin: 4.1 g/dL (ref 3.5–5.0)
Alkaline Phosphatase: 87 U/L (ref 38–126)
Anion gap: 9 (ref 5–15)
BUN: 13 mg/dL (ref 8–23)
CO2: 25 mmol/L (ref 22–32)
Calcium: 8.7 mg/dL — ABNORMAL LOW (ref 8.9–10.3)
Chloride: 103 mmol/L (ref 98–111)
Creatinine, Ser: 0.69 mg/dL (ref 0.44–1.00)
GFR, Estimated: >60 mL/min (ref >60)
Glucose, Bld: 149 mg/dL — ABNORMAL HIGH (ref 70–99)
Potassium: 4.4 mmol/L (ref 3.5–5.1)
Sodium: 137 mmol/L (ref 135–145)
Total Bilirubin: 0.9 mg/dL (ref 0.0–1.2)
Total Protein: 7 g/dL (ref 6.5–8.1)

## 2023-10-01 SURGERY — DECOMPRESSIVE LUMBAR LAMINECTOMY LEVEL 1
Anesthesia: General | Site: Back

## 2023-10-01 MED ORDER — FENTANYL CITRATE (PF) 100 MCG/2ML IJ SOLN
INTRAMUSCULAR | Status: AC
  Filled 2023-10-01: qty 2

## 2023-10-01 MED ORDER — BUPIVACAINE HCL 0.5 % IJ SOLN
INTRAMUSCULAR | Status: DC | PRN
  Administered 2023-10-01: 30 mL

## 2023-10-01 MED ORDER — REMIFENTANIL HCL 1 MG IV SOLR
INTRAVENOUS | Status: AC
  Filled 2023-10-01: qty 1000

## 2023-10-01 MED ORDER — KETAMINE HCL 50 MG/5ML IJ SOSY
PREFILLED_SYRINGE | INTRAMUSCULAR | Status: AC
  Filled 2023-10-01: qty 5

## 2023-10-01 MED ORDER — METHOCARBAMOL 500 MG PO TABS
500.0000 mg | ORAL_TABLET | Freq: Three times a day (TID) | ORAL | Status: DC | PRN
  Administered 2023-10-01 – 2023-10-02 (×2): 500 mg via ORAL
  Filled 2023-10-01 (×2): qty 1

## 2023-10-01 MED ORDER — SODIUM CHLORIDE (PF) 0.9 % IJ SOLN
INTRAMUSCULAR | Status: AC
  Filled 2023-10-01: qty 20

## 2023-10-01 MED ORDER — LIDOCAINE HCL 4 % MT SOLN
OROMUCOSAL | Status: DC | PRN
  Administered 2023-10-01: 4 mL via TOPICAL

## 2023-10-01 MED ORDER — DEXAMETHASONE SODIUM PHOSPHATE 10 MG/ML IJ SOLN
INTRAMUSCULAR | Status: AC
  Filled 2023-10-01: qty 1

## 2023-10-01 MED ORDER — CEFAZOLIN SODIUM-DEXTROSE 2-4 GM/100ML-% IV SOLN
INTRAVENOUS | Status: AC
Start: 2023-10-01 — End: ?
  Filled 2023-10-01: qty 100

## 2023-10-01 MED ORDER — LIDOCAINE HCL (PF) 2 % IJ SOLN
INTRAMUSCULAR | Status: AC
  Filled 2023-10-01: qty 5

## 2023-10-01 MED ORDER — SODIUM CHLORIDE 0.9 % IV SOLN: INTRAVENOUS | Status: DC | PRN

## 2023-10-01 MED ORDER — ATORVASTATIN CALCIUM 20 MG PO TABS
40.0000 mg | ORAL_TABLET | Freq: Every evening | ORAL | Status: DC
Start: 2023-10-01 — End: 2023-10-07
  Administered 2023-10-01 – 2023-10-06 (×6): 40 mg via ORAL
  Filled 2023-10-01 (×6): qty 2

## 2023-10-01 MED ORDER — PHENYLEPHRINE 80 MCG/ML (10ML) SYRINGE FOR IV PUSH (FOR BLOOD PRESSURE SUPPORT)
PREFILLED_SYRINGE | INTRAVENOUS | Status: DC | PRN
  Administered 2023-10-01: 160 ug via INTRAVENOUS
  Administered 2023-10-01: 80 ug via INTRAVENOUS
  Administered 2023-10-01 (×2): 160 ug via INTRAVENOUS
  Administered 2023-10-01 (×2): 80 ug via INTRAVENOUS

## 2023-10-01 MED ORDER — MIDAZOLAM HCL 2 MG/2ML IJ SOLN
INTRAMUSCULAR | Status: AC
  Filled 2023-10-01: qty 2

## 2023-10-01 MED ORDER — LIDOCAINE HCL (CARDIAC) PF 100 MG/5ML IV SOSY
PREFILLED_SYRINGE | INTRAVENOUS | Status: DC | PRN
  Administered 2023-10-01: 50 mg via INTRAVENOUS

## 2023-10-01 MED ORDER — HYDROMORPHONE HCL 1 MG/ML IJ SOLN
INTRAMUSCULAR | Status: AC
  Filled 2023-10-01: qty 1

## 2023-10-01 MED ORDER — BREXPIPRAZOLE 1 MG PO TABS
1.0000 mg | ORAL_TABLET | Freq: Every day | ORAL | Status: DC
  Administered 2023-10-01 – 2023-10-07 (×4): 1 mg via ORAL
  Filled 2023-10-01 (×8): qty 1

## 2023-10-01 MED ORDER — ACETAMINOPHEN 10 MG/ML IV SOLN
INTRAVENOUS | Status: AC
  Filled 2023-10-01: qty 100

## 2023-10-01 MED ORDER — MIDAZOLAM HCL 2 MG/2ML IJ SOLN
INTRAMUSCULAR | Status: DC | PRN
  Administered 2023-10-01: 2 mg via INTRAVENOUS

## 2023-10-01 MED ORDER — HYDROMORPHONE HCL 1 MG/ML IJ SOLN
0.2500 mg | INTRAMUSCULAR | Status: DC | PRN
  Administered 2023-10-01 (×2): 0.5 mg via INTRAVENOUS

## 2023-10-01 MED ORDER — ACETAMINOPHEN 10 MG/ML IV SOLN
INTRAVENOUS | Status: DC | PRN
  Administered 2023-10-01: 1000 mg via INTRAVENOUS

## 2023-10-01 MED ORDER — SUCCINYLCHOLINE CHLORIDE 200 MG/10ML IV SOSY
PREFILLED_SYRINGE | INTRAVENOUS | Status: AC
  Filled 2023-10-01: qty 10

## 2023-10-01 MED ORDER — OXYCODONE HCL 5 MG/5ML PO SOLN: 5.0000 mg | Freq: Once | ORAL | Status: AC | PRN

## 2023-10-01 MED ORDER — FENTANYL CITRATE (PF) 100 MCG/2ML IJ SOLN
INTRAMUSCULAR | Status: DC | PRN
  Administered 2023-10-01 (×2): 50 ug via INTRAVENOUS

## 2023-10-01 MED ORDER — VANCOMYCIN HCL IN DEXTROSE 1-5 GM/200ML-% IV SOLN
1000.0000 mg | Freq: Once | INTRAVENOUS | Status: AC
  Administered 2023-10-01: 1000 mg via INTRAVENOUS

## 2023-10-01 MED ORDER — ONDANSETRON HCL 4 MG/2ML IJ SOLN
INTRAMUSCULAR | Status: AC
  Filled 2023-10-01: qty 2

## 2023-10-01 MED ORDER — SODIUM CHLORIDE 0.9 % IV SOLN
INTRAVENOUS | Status: DC | PRN
  Administered 2023-10-01: 40 mL

## 2023-10-01 MED ORDER — ONDANSETRON HCL 4 MG/2ML IJ SOLN
INTRAMUSCULAR | Status: DC | PRN
  Administered 2023-10-01: 4 mg via INTRAVENOUS

## 2023-10-01 MED ORDER — IRRISEPT - 450ML BOTTLE WITH 0.05% CHG IN STERILE WATER, USP 99.95% OPTIME
TOPICAL | Status: DC | PRN
  Administered 2023-10-01: 450 mL

## 2023-10-01 MED ORDER — PROPOFOL 1000 MG/100ML IV EMUL
INTRAVENOUS | Status: AC
Start: 2023-10-01 — End: ?
  Filled 2023-10-01: qty 200

## 2023-10-01 MED ORDER — REMIFENTANIL HCL 1 MG IV SOLR
INTRAVENOUS | Status: DC | PRN
  Administered 2023-10-01: .1 ug/kg/min via INTRAVENOUS

## 2023-10-01 MED ORDER — KETAMINE HCL 50 MG/5ML IJ SOSY
PREFILLED_SYRINGE | INTRAMUSCULAR | Status: DC | PRN
  Administered 2023-10-01 (×2): 10 mg via INTRAVENOUS
  Administered 2023-10-01: 30 mg via INTRAVENOUS

## 2023-10-01 MED ORDER — PROPOFOL 10 MG/ML IV BOLUS
INTRAVENOUS | Status: DC | PRN
  Administered 2023-10-01: 150 mg via INTRAVENOUS

## 2023-10-01 MED ORDER — FAMOTIDINE 20 MG PO TABS
40.0000 mg | ORAL_TABLET | Freq: Every day | ORAL | Status: DC
  Administered 2023-10-01 – 2023-10-07 (×7): 40 mg via ORAL
  Filled 2023-10-01 (×7): qty 2

## 2023-10-01 MED ORDER — OXYCODONE HCL 5 MG PO TABS
ORAL_TABLET | ORAL | Status: AC
  Filled 2023-10-01: qty 1

## 2023-10-01 MED ORDER — CEFAZOLIN SODIUM-DEXTROSE 2-4 GM/100ML-% IV SOLN
2.0000 g | Freq: Three times a day (TID) | INTRAVENOUS | Status: DC
  Administered 2023-10-01 – 2023-10-05 (×13): 2 g via INTRAVENOUS
  Filled 2023-10-01 (×12): qty 100

## 2023-10-01 MED ORDER — VANCOMYCIN HCL IN DEXTROSE 1-5 GM/200ML-% IV SOLN
INTRAVENOUS | Status: AC
  Filled 2023-10-01: qty 200

## 2023-10-01 MED ORDER — PROPOFOL 10 MG/ML IV BOLUS
INTRAVENOUS | Status: AC
  Filled 2023-10-01: qty 20

## 2023-10-01 MED ORDER — PANTOPRAZOLE SODIUM 40 MG PO TBEC
40.0000 mg | DELAYED_RELEASE_TABLET | Freq: Every day | ORAL | Status: DC
  Administered 2023-10-01 – 2023-10-07 (×7): 40 mg via ORAL
  Filled 2023-10-01 (×7): qty 1

## 2023-10-01 MED ORDER — 0.9 % SODIUM CHLORIDE (POUR BTL) OPTIME
TOPICAL | Status: DC | PRN
  Administered 2023-10-01: 1000 mL
  Administered 2023-10-01: 500 mL

## 2023-10-01 MED ORDER — DEXAMETHASONE SODIUM PHOSPHATE 10 MG/ML IJ SOLN
INTRAMUSCULAR | Status: DC | PRN
  Administered 2023-10-01: 10 mg via INTRAVENOUS

## 2023-10-01 MED ORDER — OXYCODONE HCL 5 MG PO TABS
5.0000 mg | ORAL_TABLET | Freq: Once | ORAL | Status: AC | PRN
  Administered 2023-10-01: 5 mg via ORAL

## 2023-10-01 MED ORDER — GLYCOPYRROLATE 0.2 MG/ML IJ SOLN
INTRAMUSCULAR | Status: AC
  Filled 2023-10-01: qty 1

## 2023-10-01 MED ORDER — PROPOFOL 1000 MG/100ML IV EMUL
INTRAVENOUS | Status: AC
  Filled 2023-10-01: qty 100

## 2023-10-01 MED ORDER — SUCCINYLCHOLINE CHLORIDE 200 MG/10ML IV SOSY
PREFILLED_SYRINGE | INTRAVENOUS | Status: DC | PRN
  Administered 2023-10-01: 100 mg via INTRAVENOUS

## 2023-10-01 MED ORDER — GLYCOPYRROLATE 0.2 MG/ML IJ SOLN
INTRAMUSCULAR | Status: DC | PRN
  Administered 2023-10-01: .2 mg via INTRAVENOUS

## 2023-10-01 MED ORDER — PROPOFOL 500 MG/50ML IV EMUL
INTRAVENOUS | Status: DC | PRN
  Administered 2023-10-01: 150 ug/kg/min via INTRAVENOUS

## 2023-10-01 MED ORDER — PHENYLEPHRINE 80 MCG/ML (10ML) SYRINGE FOR IV PUSH (FOR BLOOD PRESSURE SUPPORT)
PREFILLED_SYRINGE | INTRAVENOUS | Status: AC
  Filled 2023-10-01: qty 10

## 2023-10-01 MED ORDER — SURGIFLO WITH THROMBIN (HEMOSTATIC MATRIX KIT) OPTIME
TOPICAL | Status: DC | PRN
  Administered 2023-10-01 (×2): 1 via TOPICAL

## 2023-10-01 MED ORDER — BUPIVACAINE-EPINEPHRINE (PF) 0.5% -1:200000 IJ SOLN
INTRAMUSCULAR | Status: DC | PRN
  Administered 2023-10-01: 30 mL via PERINEURAL

## 2023-10-01 MED ORDER — SERTRALINE HCL 50 MG PO TABS
100.0000 mg | ORAL_TABLET | Freq: Every day | ORAL | Status: DC
Start: 2023-10-01 — End: 2023-10-07
  Administered 2023-10-01 – 2023-10-07 (×7): 100 mg via ORAL
  Filled 2023-10-01 (×7): qty 2

## 2023-10-01 SURGICAL SUPPLY — 57 items
ALLOGRAFT BONE FIBER KORE 5 (Bone Implant) IMPLANT
BASIN KIT SINGLE STR (MISCELLANEOUS) ×1 IMPLANT
BUR NEURO DRILL SOFT 3.0X3.8M (BURR) ×1 IMPLANT
COVER PROBE FLX POLY STRL (MISCELLANEOUS) IMPLANT
DERMABOND ADVANCED .7 DNX12 (GAUZE/BANDAGES/DRESSINGS) IMPLANT
DRAPE C ARM PK CFD 31 SPINE (DRAPES) ×1 IMPLANT
DRAPE LAPAROTOMY 100X77 ABD (DRAPES) ×1 IMPLANT
DRAPE SCAN PATIENT (DRAPES) IMPLANT
DRESSING PEEL AND PLAC PRVNA20 (GAUZE/BANDAGES/DRESSINGS) IMPLANT
DRSG PEEL AND PLACE PREVENA 20 (GAUZE/BANDAGES/DRESSINGS) ×1 IMPLANT
ELECT REM PT RETURN 9FT ADLT (ELECTROSURGICAL) ×1 IMPLANT
ELECTRODE REM PT RTRN 9FT ADLT (ELECTROSURGICAL) ×1 IMPLANT
EVACUATOR 1/8 PVC DRAIN (DRAIN) IMPLANT
FEE INTRAOP CADWELL SUPPLY NCS (MISCELLANEOUS) IMPLANT
FEE INTRAOP MONITOR IMPULS NCS (MISCELLANEOUS) IMPLANT
GAUZE 4X4 16PLY ~~LOC~~+RFID DBL (SPONGE) IMPLANT
GLOVE BIOGEL PI IND STRL 7.0 (GLOVE) ×1 IMPLANT
GLOVE BIOGEL PI IND STRL 8 (GLOVE) ×2 IMPLANT
GLOVE SURG SYN 7.0 (GLOVE) ×2 IMPLANT
GLOVE SURG SYN 7.0 PF PI (GLOVE) ×2 IMPLANT
GLOVE SURG SYN 7.5 E (GLOVE) ×2 IMPLANT
GLOVE SURG SYN 7.5 PF PI (GLOVE) ×2 IMPLANT
GOWN SRG LRG LVL 4 IMPRV REINF (GOWNS) ×1 IMPLANT
GOWN SRG XL LVL 3 NONREINFORCE (GOWNS) ×1 IMPLANT
HOLDER FOLEY CATH W/STRAP (MISCELLANEOUS) IMPLANT
INTRAOP CADWELL SUPPLY FEE NCS (MISCELLANEOUS) ×1 IMPLANT
INTRAOP MONITOR FEE IMPULS NCS (MISCELLANEOUS) ×1 IMPLANT
INTRAOP PULSE SUPPORT NCS W/E (MISCELLANEOUS) ×1 IMPLANT
JET LAVAGE IRRISEPT WOUND (IRRIGATION / IRRIGATOR) ×1 IMPLANT
KIT PREVENA INCISION MGT 13 (CANNISTER) IMPLANT
KIT SPINAL PRONEVIEW (KITS) ×1 IMPLANT
LAVAGE JET IRRISEPT WOUND (IRRIGATION / IRRIGATOR) ×1 IMPLANT
MANIFOLD NEPTUNE II (INSTRUMENTS) ×1 IMPLANT
MARKER SKIN DUAL TIP RULER LAB (MISCELLANEOUS) ×1 IMPLANT
MARKER SPHERE PSV REFLC 13MM (MARKER) IMPLANT
NDL SAFETY ECLIPSE 18X1.5 (NEEDLE) ×1 IMPLANT
NS IRRIG 1000ML POUR BTL (IV SOLUTION) IMPLANT
NS IRRIG 500ML POUR BTL (IV SOLUTION) ×1 IMPLANT
PACK LAMINECTOMY ARMC (PACKS) ×1 IMPLANT
PAD ARMBOARD 7.5X6 YLW CONV (MISCELLANEOUS) ×1 IMPLANT
PROBE MONO 100X0.75X1.9 NCS (MISCELLANEOUS) IMPLANT
ROD RELINE-O 5.5X300 STRT NS (Rod) IMPLANT
SCREW LOCK RELINE 5.5 TULIP (Screw) IMPLANT
SCREW RELINE-O POLY 6.5X40 (Screw) IMPLANT
SCREW RELINE-O POLY 6.5X45 (Screw) IMPLANT
STAPLER SKIN PROX 35W (STAPLE) IMPLANT
SUPPORT INTRAOP PULSE NCS W/E (MISCELLANEOUS) IMPLANT
SURGIFLO W/THROMBIN 8M KIT (HEMOSTASIS) ×1 IMPLANT
SUT STRATA 3-0 15 PS-2 (SUTURE) ×1 IMPLANT
SUT VIC AB 0 CT1 27XCR 8 STRN (SUTURE) ×2 IMPLANT
SUT VIC AB 2-0 CT1 18 (SUTURE) ×2 IMPLANT
SYR 20ML LL LF (SYRINGE) IMPLANT
SYR 30ML LL (SYRINGE) ×2 IMPLANT
SYR BULB IRRIG 60ML STRL (SYRINGE) IMPLANT
TRAP FLUID SMOKE EVACUATOR (MISCELLANEOUS) ×1 IMPLANT
TRAY FOLEY MTR SLVR 16FR STAT (SET/KITS/TRAYS/PACK) IMPLANT
WATER STERILE IRR 1000ML POUR (IV SOLUTION) IMPLANT

## 2023-10-01 NOTE — Plan of Care (Signed)
  Problem: Health Behavior/Discharge Planning: Goal: Ability to identify and utilize available resources and services will improve Outcome: Progressing Goal: Ability to manage health-related needs will improve Outcome: Progressing

## 2023-10-01 NOTE — ED Notes (Signed)
 Report given to RN Maralyn Sago
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
## 2023-10-01 NOTE — Anesthesia Procedure Notes (Signed)
Procedure Name: Intubation Date/Time: 10/01/2023 8:20 AM  Performed by: Mathews Argyle, CRNAPre-anesthesia Checklist: Patient identified, Patient being monitored, Timeout performed, Emergency Drugs available and Suction available Patient Re-evaluated:Patient Re-evaluated prior to induction Oxygen Delivery Method: Circle system utilized Preoxygenation: Pre-oxygenation with 100% oxygen Induction Type: IV induction Ventilation: Mask ventilation without difficulty Laryngoscope Size: 3 and McGrath Grade View: Grade I Tube type: Oral Tube size: 7.0 mm Number of attempts: 1 Airway Equipment and Method: Stylet, Video-laryngoscopy and LTA kit utilized Placement Confirmation: ETT inserted through vocal cords under direct vision, positive ETCO2 and breath sounds checked- equal and bilateral Secured at: 21 cm Tube secured with: Tape Dental Injury: Teeth and Oropharynx as per pre-operative assessment

## 2023-10-01 NOTE — Progress Notes (Signed)
Report given to OR Nurse Brandy at this time.

## 2023-10-01 NOTE — Assessment & Plan Note (Signed)
Blood pressure currently borderline soft. -Holding home losartan-we will resume as needed

## 2023-10-01 NOTE — Anesthesia Postprocedure Evaluation (Addendum)
Anesthesia Post Note  Patient: Brandi Jordan  Procedure(s) Performed: Transpedicual Decompression L1 (Back) T11-L3, Posterior Spinal Instrumentation and Fusion, with Fracture Reduction (Back)  Patient location during evaluation: PACU Anesthesia Type: General Level of consciousness: awake and alert Pain management: pain level controlled Vital Signs Assessment: post-procedure vital signs reviewed and stable Respiratory status: spontaneous breathing, nonlabored ventilation, respiratory function stable and patient connected to nasal cannula oxygen Cardiovascular status: blood pressure returned to baseline and stable Postop Assessment: no apparent nausea or vomiting Anesthetic complications: no   No notable events documented.   Last Vitals:  Vitals:   10/01/23 0000 10/01/23 0109  BP: 124/72 119/63  Pulse: 98 97  Resp: 18 18  Temp:  37.7 C  SpO2: 95% 95%    Last Pain:  Vitals:   10/01/23 0646  TempSrc:   PainSc: 8                  Louie Boston

## 2023-10-01 NOTE — Assessment & Plan Note (Signed)
 Continue home Protonix and Pepcid.

## 2023-10-01 NOTE — Assessment & Plan Note (Signed)
Patient uses Lantus and semaglutide at home. -Continue with SSI

## 2023-10-01 NOTE — Transfer of Care (Signed)
Immediate Anesthesia Transfer of Care Note  Patient: Brandi Jordan  Procedure(s) Performed: Transpedicual Decompression L1 (Back) T11-L3, Posterior Spinal Instrumentation and Fusion, with Fracture Reduction (Back) APPLICATION OF WOUND VAC (Back)  Patient Location: PACU  Anesthesia Type:General  Level of Consciousness: drowsy  Airway & Oxygen Therapy: Patient Spontanous Breathing and Patient connected to face mask oxygen  Post-op Assessment: Report given to RN and Post -op Vital signs reviewed and stable  Post vital signs: Reviewed  Last Vitals:  Vitals Value Taken Time  BP 132/63 10/01/23 1336  Temp    Pulse 87 10/01/23 1338  Resp 12 10/01/23 1338  SpO2 96 % 10/01/23 1338  Vitals shown include unfiled device data.  Last Pain:      Patients Stated Pain Goal: 0 (10/01/23 0646)  Complications: No notable events documented.

## 2023-10-01 NOTE — Plan of Care (Signed)
  Problem: Fluid Volume: Goal: Ability to maintain a balanced intake and output will improve Outcome: Progressing   Problem: Metabolic: Goal: Ability to maintain appropriate glucose levels will improve Outcome: Progressing   
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
## 2023-10-01 NOTE — Anesthesia Preprocedure Evaluation (Signed)
Anesthesia Evaluation  Patient identified by MRN, date of birth, ID band Patient awake    Reviewed: Allergy & Precautions, NPO status , Patient's Chart, lab work & pertinent test results  History of Anesthesia Complications Negative for: history of anesthetic complications  Airway Mallampati: III  TM Distance: >3 FB Neck ROM: full    Dental no notable dental hx.    Pulmonary neg pulmonary ROS, former smoker   Pulmonary exam normal        Cardiovascular Exercise Tolerance: Good hypertension, On Medications negative cardio ROS Normal cardiovascular exam     Neuro/Psych  Headaches PSYCHIATRIC DISORDERS Anxiety      Neuromuscular disease    GI/Hepatic Neg liver ROS, hiatal hernia,GERD  Medicated,,  Endo/Other  negative endocrine ROSdiabetes, Poorly Controlled, Type 2, Insulin Dependent    Renal/GU      Musculoskeletal   Abdominal   Peds  Hematology negative hematology ROS (+)   Anesthesia Other Findings Past Medical History: No date: Anxiety No date: Arthritis No date: B12 deficiency No date: Dermatitis No date: Diabetes mellitus without complication (HCC) No date: Dyslipidemia No date: GERD (gastroesophageal reflux disease) No date: Hypertension No date: Knee pain No date: Overweight No date: Wears contact lenses     Comment:  sometimes No date: Wears dentures     Comment:  partial upper  Past Surgical History: No date: ABDOMINAL HYSTERECTOMY No date: BREAST BIOPSY; Right     Comment:  rt cyst removed No date: BUNIONECTOMY; Bilateral     Comment:  9 years apart 09/01/2010: COLONOSCOPY     Comment:  Iftikhar-diverticulosis only 03/03/2015: COLONOSCOPY WITH PROPOFOL; N/A     Comment:  Procedure: COLONOSCOPY WITH PROPOFOL;  Surgeon: Midge Minium, MD;  Location: [facility];  Service: Endoscopy;              Laterality: N/A; 10/18/2021: COLONOSCOPY WITH PROPOFOL; N/A     Comment:   Procedure: COLONOSCOPY WITH BIOPSY;  Surgeon: Midge Minium, MD;  Location: [facility];  Service:               Endoscopy;  Laterality: N/A;  Diabetic 03/03/2015: ESOPHAGOGASTRODUODENOSCOPY (EGD) WITH PROPOFOL; N/A     Comment:  Procedure: ESOPHAGOGASTRODUODENOSCOPY (EGD) WITH               PROPOFOL;  Surgeon: Midge Minium, MD;  Location: [facility]               ENDOSCOPY;  Service: Endoscopy;  Laterality: N/A; 03/16/2020: KNEE ARTHROSCOPY WITH MEDIAL MENISECTOMY; Right     Comment:  Procedure: Right knee arthroscopy with partial medial               and lateral menisectomy, micro-fracture, partial               synovectomy;  Surgeon: Lyndle Herrlich, MD;  Location:               [facility] ORS;  Service: Orthopedics;  Laterality: Right; 1990: PARTIAL HYSTERECTOMY     Comment:  Partial 10/18/2021: POLYPECTOMY; N/A     Comment:  Procedure: POLYPECTOMY;  Surgeon: Midge Minium, MD;                Location: [facility];  Service: Endoscopy;  Laterality: N/A;  BMI    Body Mass Index: 27.46 kg/m      Reproductive/Obstetrics negative OB ROS                              Anesthesia Physical Anesthesia Plan  ASA: 3  Anesthesia Plan: General ETT   Post-op Pain Management: Toradol IV (intra-op)*, Ofirmev IV (intra-op)*, Dilaudid IV and Ketamine IV*   Induction: Intravenous and Rapid sequence  PONV Risk Score and Plan: 3 and Ondansetron, Dexamethasone, Midazolam and Treatment may vary due to age or medical condition  Airway Management Planned: Oral ETT  Additional Equipment:   Intra-op Plan:   Post-operative Plan: Extubation in OR  Informed Consent: I have reviewed the patients History and Physical, chart, labs and discussed the procedure including the risks, benefits and alternatives for the proposed anesthesia with the patient or authorized representative who has indicated his/her understanding and acceptance.     Dental  Advisory Given  Plan Discussed with: Anesthesiologist, CRNA and Surgeon  Anesthesia Plan Comments: (Patient consented for risks of anesthesia including but not limited to:  - adverse reactions to medications - damage to eyes, teeth, lips or other oral mucosa - nerve damage due to positioning  - sore throat or hoarseness - Damage to heart, brain, nerves, lungs, other parts of body or loss of life  Patient voiced understanding and assent.)         Anesthesia Quick Evaluation
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
## 2023-10-02 DIAGNOSIS — E119 Type 2 diabetes mellitus without complications: Secondary | ICD-10-CM | POA: Diagnosis not present

## 2023-10-02 DIAGNOSIS — S32000A Wedge compression fracture of unspecified lumbar vertebra, initial encounter for closed fracture: Secondary | ICD-10-CM | POA: Diagnosis not present

## 2023-10-02 DIAGNOSIS — F411 Generalized anxiety disorder: Secondary | ICD-10-CM | POA: Diagnosis not present

## 2023-10-02 DIAGNOSIS — R338 Other retention of urine: Secondary | ICD-10-CM | POA: Insufficient documentation

## 2023-10-02 LAB — CBC
HCT: 32.1 % — ABNORMAL LOW (ref 36.0–46.0)
Hemoglobin: 10.9 g/dL — ABNORMAL LOW (ref 12.0–15.0)
MCH: 27.8 pg (ref 26.0–34.0)
MCHC: 34 g/dL (ref 30.0–36.0)
MCV: 81.9 fL (ref 80.0–100.0)
Platelets: 200 10*3/uL (ref 150–400)
RBC: 3.92 MIL/uL (ref 3.87–5.11)
RDW: 14.6 % (ref 11.5–15.5)
WBC: 13.1 10*3/uL — ABNORMAL HIGH (ref 4.0–10.5)
nRBC: 0 % (ref 0.0–0.2)

## 2023-10-02 LAB — BASIC METABOLIC PANEL
Anion gap: 6 (ref 5–15)
BUN: 13 mg/dL (ref 8–23)
CO2: 25 mmol/L (ref 22–32)
Calcium: 8.3 mg/dL — ABNORMAL LOW (ref 8.9–10.3)
Chloride: 106 mmol/L (ref 98–111)
Creatinine, Ser: 0.57 mg/dL (ref 0.44–1.00)
GFR, Estimated: >60 mL/min (ref >60)
Glucose, Bld: 181 mg/dL — ABNORMAL HIGH (ref 70–99)
Potassium: 4.1 mmol/L (ref 3.5–5.1)
Sodium: 137 mmol/L (ref 135–145)

## 2023-10-02 LAB — GLUCOSE, CAPILLARY
Glucose-Capillary: 168 mg/dL — ABNORMAL HIGH (ref 70–99)
Glucose-Capillary: 181 mg/dL — ABNORMAL HIGH (ref 70–99)
Glucose-Capillary: 229 mg/dL — ABNORMAL HIGH (ref 70–99)
Glucose-Capillary: 179 mg/dL — ABNORMAL HIGH (ref 70–99)
Glucose-Capillary: 180 mg/dL — ABNORMAL HIGH (ref 70–99)

## 2023-10-02 MED ORDER — OXYCODONE HCL 5 MG PO TABS
10.0000 mg | ORAL_TABLET | ORAL | Status: DC | PRN
  Administered 2023-10-02 – 2023-10-06 (×19): 10 mg via ORAL
  Filled 2023-10-02 (×19): qty 2

## 2023-10-02 MED ORDER — MORPHINE SULFATE (PF) 2 MG/ML IV SOLN
1.0000 mg | INTRAVENOUS | Status: AC | PRN
  Administered 2023-10-02 (×2): 1 mg via INTRAVENOUS
  Filled 2023-10-02 (×2): qty 1

## 2023-10-02 MED ORDER — METHOCARBAMOL 500 MG PO TABS
500.0000 mg | ORAL_TABLET | Freq: Three times a day (TID) | ORAL | Status: DC
  Administered 2023-10-02 – 2023-10-07 (×15): 500 mg via ORAL
  Filled 2023-10-02 (×16): qty 1

## 2023-10-02 MED ORDER — DIPHENHYDRAMINE HCL 25 MG PO CAPS
25.0000 mg | ORAL_CAPSULE | Freq: Once | ORAL | Status: AC
  Administered 2023-10-02: 25 mg via ORAL
  Filled 2023-10-02 (×2): qty 1

## 2023-10-02 MED ORDER — CHLORHEXIDINE GLUCONATE CLOTH 2 % EX PADS
6.0000 | MEDICATED_PAD | Freq: Every day | CUTANEOUS | Status: DC
  Administered 2023-10-02 – 2023-10-04 (×3): 6 via TOPICAL

## 2023-10-02 MED ORDER — FE FUM-VIT C-VIT B12-FA 460-60-0.01-1 MG PO CAPS
1.0000 | ORAL_CAPSULE | Freq: Two times a day (BID) | ORAL | Status: DC
  Administered 2023-10-02 – 2023-10-07 (×11): 1 via ORAL
  Filled 2023-10-02 (×11): qty 1

## 2023-10-02 MED ORDER — ACETAMINOPHEN 325 MG PO TABS
650.0000 mg | ORAL_TABLET | Freq: Four times a day (QID) | ORAL | Status: DC
  Administered 2023-10-02 – 2023-10-07 (×20): 650 mg via ORAL
  Filled 2023-10-02 (×20): qty 2

## 2023-10-02 MED ORDER — OXYCODONE HCL 5 MG PO TABS
5.0000 mg | ORAL_TABLET | ORAL | Status: DC | PRN
  Administered 2023-10-06 – 2023-10-07 (×3): 5 mg via ORAL
  Filled 2023-10-02 (×3): qty 1
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
## 2023-10-02 NOTE — Assessment & Plan Note (Signed)
Patient developed preoperative urinary retention, had spinal cord compression.  Foley catheter was placed. -Neurosurgery is recommending continue Foley catheter for another 3 days. -Continue to monitor

## 2023-10-02 NOTE — Inpatient Diabetes Management (Signed)
Inpatient Diabetes Program Recommendations  AACE/ADA: New Consensus Statement on Inpatient Glycemic Control (2015)  Target Ranges:  Prepandial:   less than 140 mg/dL      Peak postprandial:   less than 180 mg/dL (1-2 hours)      Critically ill patients:  140 - 180 mg/dL   Lab Results  Component Value Date   GLUCAP 229 (H) 10/02/2023   HGBA1C 7.1 (H) 09/30/2023    Review of Glycemic Control  Latest Reference Range & Units 10/01/23 17:38 10/01/23 21:52 10/02/23 08:24 10/02/23 12:26  Glucose-Capillary 70 - 99 mg/dL 295 (H) 621 (H) 308 (H) 229 (H)  (H): Data is abnormally high Diabetes history: Type 2 DM Outpatient Diabetes medications: Lantus 80 units every day, Ozempic 0.5 mg qwk Current orders for Inpatient glycemic control: Novolog 0-9 units TID Decadron 10 mg x 1  Inpatient Diabetes Program Recommendations:    Consider adding Semglee 10 units every day  Thanks, Lujean Rave, MSN, RNC-OB Diabetes Coordinator [phone] (8a-5p)

## 2023-10-02 NOTE — Plan of Care (Signed)
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
## 2023-10-02 NOTE — Plan of Care (Signed)
  Problem: Education: Goal: Ability to describe self-care measures that may prevent or decrease complications (Diabetes Survival Skills Education) will improve Outcome: Progressing Goal: Individualized Educational Video(s) Outcome: Progressing   Problem: Nutritional: Goal: Maintenance of adequate nutrition will improve Outcome: Progressing Goal: Progress toward achieving an optimal weight will improve Outcome: Progressing   Problem: Skin Integrity: Goal: Risk for impaired skin integrity will decrease Outcome: Progressing   Problem: Tissue Perfusion: Goal: Adequacy of tissue perfusion will improve Outcome: Progressing   Problem: Clinical Measurements: Goal: Ability to maintain clinical measurements within normal limits will improve Outcome: Progressing Goal: Will remain free from infection Outcome: Progressing Goal: Diagnostic test results will improve Outcome: Progressing Goal: Respiratory complications will improve Outcome: Progressing Goal: Cardiovascular complication will be avoided Outcome: Progressing   Problem: Activity: Goal: Risk for activity intolerance will decrease Outcome: Progressing   Problem: Elimination: Goal: Will not experience complications related to bowel motility Outcome: Progressing Goal: Will not experience complications related to urinary retention Outcome: Progressing   Problem: Pain Managment: Goal: General experience of comfort will improve and/or be controlled Outcome: Progressing   Problem: Safety: Goal: Ability to remain free from injury will improve Outcome: Progressing
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
## 2023-10-03 DIAGNOSIS — F411 Generalized anxiety disorder: Secondary | ICD-10-CM | POA: Diagnosis not present

## 2023-10-03 DIAGNOSIS — S32000A Wedge compression fracture of unspecified lumbar vertebra, initial encounter for closed fracture: Secondary | ICD-10-CM | POA: Diagnosis not present

## 2023-10-03 DIAGNOSIS — E119 Type 2 diabetes mellitus without complications: Secondary | ICD-10-CM | POA: Diagnosis not present

## 2023-10-03 LAB — GLUCOSE, CAPILLARY
Glucose-Capillary: 141 mg/dL — ABNORMAL HIGH (ref 70–99)
Glucose-Capillary: 210 mg/dL — ABNORMAL HIGH (ref 70–99)
Glucose-Capillary: 190 mg/dL — ABNORMAL HIGH (ref 70–99)
Glucose-Capillary: 198 mg/dL — ABNORMAL HIGH (ref 70–99)
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
## 2023-10-03 NOTE — Plan of Care (Signed)
  Problem: Skin Integrity: Goal: Risk for impaired skin integrity will decrease Outcome: Progressing   Problem: Clinical Measurements: Goal: Ability to maintain clinical measurements within normal limits will improve Outcome: Progressing Goal: Will remain free from infection Outcome: Progressing Goal: Diagnostic test results will improve Outcome: Progressing Goal: Respiratory complications will improve Outcome: Progressing Goal: Cardiovascular complication will be avoided Outcome: Progressing   Problem: Pain Managment: Goal: General experience of comfort will improve and/or be controlled Outcome: Progressing   Problem: Safety: Goal: Ability to remain free from injury will improve Outcome: Progressing   Problem: Skin Integrity: Goal: Risk for impaired skin integrity will decrease Outcome: Progressing
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
## 2023-10-03 NOTE — Plan of Care (Signed)

## 2023-10-04 DIAGNOSIS — E119 Type 2 diabetes mellitus without complications: Secondary | ICD-10-CM | POA: Diagnosis not present

## 2023-10-04 DIAGNOSIS — S32000A Wedge compression fracture of unspecified lumbar vertebra, initial encounter for closed fracture: Secondary | ICD-10-CM | POA: Diagnosis not present

## 2023-10-04 DIAGNOSIS — F411 Generalized anxiety disorder: Secondary | ICD-10-CM | POA: Diagnosis not present

## 2023-10-04 LAB — GLUCOSE, CAPILLARY
Glucose-Capillary: 176 mg/dL — ABNORMAL HIGH (ref 70–99)
Glucose-Capillary: 170 mg/dL — ABNORMAL HIGH (ref 70–99)
Glucose-Capillary: 163 mg/dL — ABNORMAL HIGH (ref 70–99)
Glucose-Capillary: 252 mg/dL — ABNORMAL HIGH (ref 70–99)

## 2023-10-04 MED ORDER — INSULIN ASPART 100 UNIT/ML IJ SOLN
0.0000 [IU] | Freq: Every day | INTRAMUSCULAR | Status: DC
  Administered 2023-10-04: 3 [IU] via SUBCUTANEOUS
  Filled 2023-10-04: qty 1

## 2023-10-04 MED ORDER — INSULIN ASPART 100 UNIT/ML IJ SOLN
0.0000 [IU] | Freq: Three times a day (TID) | INTRAMUSCULAR | Status: DC
  Administered 2023-10-04 – 2023-10-06 (×6): 3 [IU] via SUBCUTANEOUS
  Administered 2023-10-06: 2 [IU] via SUBCUTANEOUS
  Administered 2023-10-06: 3 [IU] via SUBCUTANEOUS
  Administered 2023-10-07: 2 [IU] via SUBCUTANEOUS
  Filled 2023-10-04 (×9): qty 1

## 2023-10-04 MED ORDER — POLYETHYLENE GLYCOL 3350 17 G PO PACK
17.0000 g | PACK | Freq: Every day | ORAL | Status: DC
Start: 2023-10-04 — End: 2023-10-05
  Administered 2023-10-04 – 2023-10-05 (×2): 17 g via ORAL
  Filled 2023-10-04 (×2): qty 1
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
## 2023-10-04 NOTE — TOC Progression Note (Signed)
 Transition of Care [facility]) - Progression Note    Patient Details  Name: Brandi Jordan MRN: [id number] Date of Birth: [birth year]  Transition of Care [facility]) CM/SW Contact  Royanne JINNY Bernheim, RN Phone Number: 10/04/2023, 4:14 PM  Clinical Narrative:    Ded 0$/$150 OOP $430/$6000 120 days open access plan thru employer Will not need to pay anything up front [facility] and [facility]   Expected Discharge Plan: Skilled Nursing Facility Barriers to Discharge: SNF Pending bed offer, Insurance Authorization  Expected Discharge Plan and Services   Discharge Planning Services: CM Consult   Living arrangements for the past 2 months: Single Family Home                   DME Agency: NA       HH Arranged: NA           Social Determinants of Health (SDOH) Interventions SDOH Screenings   Food Insecurity: No Food Insecurity (10/01/2023)  Housing: Low Risk  (10/03/2023)  Recent Concern: Housing - High Risk (10/01/2023)  Transportation Needs: No Transportation Needs (10/01/2023)  Utilities: Not At Risk (10/01/2023)  Financial Resource Strain: Low Risk  (08/29/2023)   Received from [facility]  Tobacco Use: Medium Risk (09/30/2023)    Readmission Risk Interventions     No data to display

## 2023-10-05 ENCOUNTER — Encounter: Payer: Self-pay | Admitting: Neurosurgery

## 2023-10-05 DIAGNOSIS — E119 Type 2 diabetes mellitus without complications: Secondary | ICD-10-CM | POA: Diagnosis not present

## 2023-10-05 DIAGNOSIS — S32000A Wedge compression fracture of unspecified lumbar vertebra, initial encounter for closed fracture: Secondary | ICD-10-CM | POA: Diagnosis not present

## 2023-10-05 DIAGNOSIS — F411 Generalized anxiety disorder: Secondary | ICD-10-CM | POA: Diagnosis not present

## 2023-10-05 LAB — GLUCOSE, CAPILLARY
Glucose-Capillary: 166 mg/dL — ABNORMAL HIGH (ref 70–99)
Glucose-Capillary: 187 mg/dL — ABNORMAL HIGH (ref 70–99)
Glucose-Capillary: 167 mg/dL — ABNORMAL HIGH (ref 70–99)
Glucose-Capillary: 162 mg/dL — ABNORMAL HIGH (ref 70–99)

## 2023-10-05 MED ORDER — INSULIN GLARGINE-YFGN 100 UNIT/ML ~~LOC~~ SOLN
10.0000 [IU] | Freq: Every day | SUBCUTANEOUS | Status: DC
  Administered 2023-10-05 – 2023-10-07 (×3): 10 [IU] via SUBCUTANEOUS
  Filled 2023-10-05 (×3): qty 0.1

## 2023-10-05 MED ORDER — ENOXAPARIN SODIUM 40 MG/0.4ML IJ SOSY
40.0000 mg | PREFILLED_SYRINGE | Freq: Every day | INTRAMUSCULAR | Status: DC
  Administered 2023-10-05 – 2023-10-06 (×2): 40 mg via SUBCUTANEOUS
  Filled 2023-10-05 (×2): qty 0.4

## 2023-10-05 MED ORDER — SENNOSIDES-DOCUSATE SODIUM 8.6-50 MG PO TABS
1.0000 | ORAL_TABLET | Freq: Two times a day (BID) | ORAL | Status: DC
Start: 2023-10-05 — End: 2023-10-07
  Administered 2023-10-05 – 2023-10-07 (×5): 1 via ORAL
  Filled 2023-10-05 (×5): qty 1

## 2023-10-05 MED ORDER — POLYETHYLENE GLYCOL 3350 17 G PO PACK
17.0000 g | PACK | Freq: Two times a day (BID) | ORAL | Status: DC
  Administered 2023-10-05 – 2023-10-07 (×4): 17 g via ORAL
  Filled 2023-10-05 (×4): qty 1

## 2023-10-05 MED ORDER — ALUM & MAG HYDROXIDE-SIMETH 200-200-20 MG/5ML PO SUSP
30.0000 mL | ORAL | Status: DC | PRN
  Administered 2023-10-05 – 2023-10-06 (×3): 30 mL via ORAL
  Filled 2023-10-05 (×3): qty 30
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
## 2023-10-05 NOTE — Plan of Care (Signed)

## 2023-10-06 DIAGNOSIS — D329 Benign neoplasm of meninges, unspecified: Secondary | ICD-10-CM | POA: Diagnosis not present

## 2023-10-06 DIAGNOSIS — R52 Pain, unspecified: Secondary | ICD-10-CM | POA: Diagnosis not present

## 2023-10-06 DIAGNOSIS — R338 Other retention of urine: Secondary | ICD-10-CM

## 2023-10-06 DIAGNOSIS — S32000A Wedge compression fracture of unspecified lumbar vertebra, initial encounter for closed fracture: Secondary | ICD-10-CM | POA: Diagnosis not present

## 2023-10-06 DIAGNOSIS — F411 Generalized anxiety disorder: Secondary | ICD-10-CM | POA: Diagnosis not present

## 2023-10-06 LAB — GLUCOSE, CAPILLARY
Glucose-Capillary: 142 mg/dL — ABNORMAL HIGH (ref 70–99)
Glucose-Capillary: 193 mg/dL — ABNORMAL HIGH (ref 70–99)
Glucose-Capillary: 157 mg/dL — ABNORMAL HIGH (ref 70–99)
Glucose-Capillary: 183 mg/dL — ABNORMAL HIGH (ref 70–99)

## 2023-10-06 MED ORDER — OXYCODONE HCL 5 MG PO TABS
5.0000 mg | ORAL_TABLET | ORAL | 0 refills | Status: DC | PRN
Start: 2023-10-06 — End: 2023-11-13

## 2023-10-06 MED ORDER — SENNOSIDES-DOCUSATE SODIUM 8.6-50 MG PO TABS: 1.0000 | ORAL_TABLET | Freq: Two times a day (BID) | ORAL | Status: DC

## 2023-10-06 MED ORDER — FE FUM-VIT C-VIT B12-FA 460-60-0.01-1 MG PO CAPS: 1.0000 | ORAL_CAPSULE | Freq: Two times a day (BID) | ORAL | Status: AC

## 2023-10-06 MED ORDER — LANTUS SOLOSTAR 100 UNIT/ML ~~LOC~~ SOPN: 20.0000 [IU] | PEN_INJECTOR | Freq: Every day | SUBCUTANEOUS | Status: AC

## 2023-10-06 MED ORDER — ONDANSETRON HCL 4 MG PO TABS: 4.0000 mg | ORAL_TABLET | Freq: Four times a day (QID) | ORAL | Status: AC | PRN

## 2023-10-06 MED ORDER — ALUM & MAG HYDROXIDE-SIMETH 200-200-20 MG/5ML PO SUSP: 30.0000 mL | ORAL | Status: AC | PRN

## 2023-10-06 MED ORDER — SMOG ENEMA
400.0000 mL | Freq: Once | RECTAL | Status: DC
  Filled 2023-10-06: qty 960

## 2023-10-06 MED ORDER — BISACODYL 10 MG RE SUPP
10.0000 mg | Freq: Once | RECTAL | Status: AC
  Administered 2023-10-06: 10 mg via RECTAL

## 2023-10-06 MED ORDER — POLYETHYLENE GLYCOL 3350 17 G PO PACK: 17.0000 g | PACK | Freq: Two times a day (BID) | ORAL | Status: DC

## 2023-10-06 MED ORDER — METHOCARBAMOL 500 MG PO TABS: 500.0000 mg | ORAL_TABLET | Freq: Three times a day (TID) | ORAL | Status: AC

## 2023-10-06 MED ORDER — LIDOCAINE 5 % EX PTCH: 1.0000 | MEDICATED_PATCH | CUTANEOUS | Status: DC
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
## 2023-10-06 NOTE — TOC Progression Note (Signed)
 Transition of Care [facility]) - Progression Note    Patient Details  Name: Brandi Jordan MRN: [id number] Date of Birth: 1960/11/13  Transition of Care [facility]) CM/SW Contact  Royanne JINNY Bernheim, RN Phone Number: 10/06/2023, 2:23 PM  Clinical Narrative:     Ins approved to go [facility] today  Expected Discharge Plan: Skilled Nursing Facility Barriers to Discharge: SNF Pending bed offer, Insurance Authorization  Expected Discharge Plan and Services   Discharge Planning Services: CM Consult   Living arrangements for the past 2 months: Single Family Home                   DME Agency: NA       HH Arranged: NA           Social Determinants of Health (SDOH) Interventions SDOH Screenings   Food Insecurity: No Food Insecurity (10/01/2023)  Housing: Low Risk  (10/03/2023)  Recent Concern: Housing - High Risk (10/01/2023)  Transportation Needs: No Transportation Needs (10/01/2023)  Utilities: Not At Risk (10/01/2023)  Financial Resource Strain: Low Risk  (08/29/2023)   Received from [facility]  Tobacco Use: Medium Risk (09/30/2023)    Readmission Risk Interventions     No data to display
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
## 2023-10-06 NOTE — Plan of Care (Signed)
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
## 2023-10-06 NOTE — TOC Progression Note (Signed)
 Transition of Care [facility]) - Progression Note    Patient Details  Name: Brandi Jordan MRN: [id number] Date of Birth: 15-Oct-1960  [facility] CM/SW Contact  Royanne JINNY Bernheim, RN Phone Number: 10/06/2023, 10:14 AM  Clinical Narrative:    Ins pending to go to [facility]   Expected Discharge Plan: Skilled Nursing Facility Barriers to Discharge: SNF Pending bed offer, Insurance Authorization  Expected Discharge Plan and Services   Discharge Planning Services: CM Consult   Living arrangements for the past 2 months: Single Family Home                   DME Agency: NA       HH Arranged: NA           Social Determinants of Health (SDOH) Interventions SDOH Screenings   Food Insecurity: No Food Insecurity (10/01/2023)  Housing: Low Risk  (10/03/2023)  Recent Concern: Housing - High Risk (10/01/2023)  Transportation Needs: No Transportation Needs (10/01/2023)  Utilities: Not At Risk (10/01/2023)  Financial Resource Strain: Low Risk  (08/29/2023)   Received from [facility]  Tobacco Use: Medium Risk (09/30/2023)    Readmission Risk Interventions     No data to display
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
## 2023-10-06 NOTE — Plan of Care (Signed)

## 2023-10-07 LAB — GLUCOSE, CAPILLARY
Glucose-Capillary: 145 mg/dL — ABNORMAL HIGH (ref 70–99)
Glucose-Capillary: 148 mg/dL — ABNORMAL HIGH (ref 70–99)
Glucose-Capillary: 190 mg/dL — ABNORMAL HIGH (ref 70–99)

## 2023-10-07 NOTE — Plan of Care (Signed)
 Patient discharged per MD orders at this time.All dc instructions, education & medications reviewed with the patient.Pt expressed understanding and will comply with dc instructions. Follow up appointments was also communicated to the patient.no verbal c/o or any ssx of distress.Pt was dc to the [facility] for STR/PT/OT services per order. Report was called to staff nurse April before transport.Pt was transported by 2 [facility] on a stretcher.
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
## 2023-10-07 NOTE — TOC Transition Note (Signed)
 Transition of Care [facility]) - Discharge Note   Patient Details  Name: Brandi Jordan MRN: [id number] Date of Birth: 1960-09-20  Transition of Care [facility]) CM/SW Contact:  Rolin JONETTA Kerns, LCSW Phone Number: 10/07/2023, 11:20 AM   Clinical Narrative:    Discharge to [facility] today. Room 08A. Confirmed with Admissions Worker Bascom. Updated MD, RN, and Patient. Asked RN to call report to [phone]. EMS paperwork completed. EMS arranged, patient is 1st on the list for [facility]   Final next level of care:  (TBD) Barriers to Discharge: SNF Pending bed offer, Insurance Authorization   Patient Goals and CMS Choice            Discharge Placement                       Discharge Plan and Services Additional resources added to the After Visit Summary for     Discharge Planning Services: CM Consult              DME Agency: NA       HH Arranged: NA          Social Drivers of Health (SDOH) Interventions SDOH Screenings   Food Insecurity: No Food Insecurity (10/01/2023)  Housing: Low Risk  (10/03/2023)  Recent Concern: Housing - High Risk (10/01/2023)  Transportation Needs: No Transportation Needs (10/01/2023)  Utilities: Not At Risk (10/01/2023)  Financial Resource Strain: Low Risk  (08/29/2023)   Received from [facility]  Tobacco Use: Medium Risk (09/30/2023)     Readmission Risk Interventions     No data to display

## 2023-10-07 NOTE — Progress Notes (Signed)
 Patient was discharged to SNF yesterday.  Due to having a late bowel movement, she was unable to leave.  Patient remained stable with no new changes.  Had a good bowel movement yesterday.  She will be going to SNF for further rehab today.

## 2023-10-07 NOTE — TOC CM/SW Note (Signed)
 Incoming call from [employer]. He endorsed frustration that he and pt's daughter was out of the loop during tx planning for patient noting that family would have preferred discharge home with [facility] services. Mr. Nicholaus reports that patient arrived at [facility] and nurse staff felt pt was appropriate for [facility] services vs. SNF placement.   CSW provided validation and encouragement. Family agreed to discuss tx planning with [facility]. No additional concerns noted

## 2023-10-09 ENCOUNTER — Ambulatory Visit: Payer: Managed Care, Other (non HMO) | Admitting: Neurosurgery

## 2023-10-10 ENCOUNTER — Telehealth: Payer: Self-pay | Admitting: Neurosurgery

## 2023-10-10 NOTE — Telephone Encounter (Signed)
Patient had a T11-L3 posterior spinal fusion, L1 transpedicular decompression on 10/02/23 is calling to find out what her estimated time out of work will be. Please advise.

## 2023-10-10 NOTE — Telephone Encounter (Signed)
Note sent via MyChart.

## 2023-10-10 NOTE — Telephone Encounter (Signed)
I called patient to discuss her job and ability to maintain restrictions. 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 We will keep her out until 11/13/2023 as she will discuss with Dr. Katrinka Blazing. She will be sending [place] FMLA paperwork which I told her that Alexia Freestone would take care of. I will send her letter to stay out of work until just after her appointment with Dr. Katrinka Blazing. She could not see her appointment for 2/17 and didn't realize that her [facility] and [facility] MyCharts were separate. I assisted her with navigating MyChart.

## 2023-10-16 ENCOUNTER — Ambulatory Visit (INDEPENDENT_AMBULATORY_CARE_PROVIDER_SITE_OTHER): Payer: Managed Care, Other (non HMO) | Admitting: Physician Assistant

## 2023-10-16 ENCOUNTER — Encounter: Payer: Self-pay | Admitting: Physician Assistant

## 2023-10-16 VITALS — BP 154/77 | HR 102 | Temp 98.5°F | Ht 64.0 in | Wt 150.0 lb

## 2023-10-16 DIAGNOSIS — Z9889 Other specified postprocedural states: Secondary | ICD-10-CM

## 2023-10-16 DIAGNOSIS — G9520 Unspecified cord compression: Secondary | ICD-10-CM

## 2023-10-16 DIAGNOSIS — Z09 Encounter for follow-up examination after completed treatment for conditions other than malignant neoplasm: Secondary | ICD-10-CM

## 2023-10-16 DIAGNOSIS — S32012D Unstable burst fracture of first lumbar vertebra, subsequent encounter for fracture with routine healing: Secondary | ICD-10-CM
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
## 2023-10-24 ENCOUNTER — Other Ambulatory Visit: Payer: Self-pay | Admitting: Internal Medicine

## 2023-10-24 DIAGNOSIS — R519 Headache, unspecified: Secondary | ICD-10-CM

## 2023-11-01 ENCOUNTER — Ambulatory Visit
Admission: RE | Admit: 2023-11-01 | Discharge: 2023-11-01 | Disposition: A | Discharge status: Home / Self Care | Payer: Managed Care, Other (non HMO) | Source: Ambulatory Visit | Attending: Internal Medicine | Admitting: Internal Medicine

## 2023-11-01 DIAGNOSIS — R519 Headache, unspecified: Secondary | ICD-10-CM

## 2023-11-10 ENCOUNTER — Telehealth: Payer: Self-pay

## 2023-11-10 DIAGNOSIS — S32012S Unstable burst fracture of first lumbar vertebra, sequela: Secondary | ICD-10-CM

## 2023-11-13 ENCOUNTER — Ambulatory Visit
Admission: RE | Admit: 2023-11-13 | Discharge: 2023-11-13 | Disposition: A | Discharge status: Home / Self Care | Attending: Neurosurgery | Admitting: Neurosurgery

## 2023-11-13 ENCOUNTER — Ambulatory Visit
Admission: RE | Admit: 2023-11-13 | Discharge: 2023-11-13 | Disposition: A | Discharge status: Home / Self Care | Source: Ambulatory Visit | Attending: Neurosurgery | Admitting: Neurosurgery

## 2023-11-13 ENCOUNTER — Ambulatory Visit (INDEPENDENT_AMBULATORY_CARE_PROVIDER_SITE_OTHER): Payer: Managed Care, Other (non HMO) | Admitting: Neurosurgery

## 2023-11-13 VITALS — BP 122/72 | Temp 97.9°F | Ht 64.0 in | Wt 150.0 lb

## 2023-11-13 DIAGNOSIS — S32012S Unstable burst fracture of first lumbar vertebra, sequela: Secondary | ICD-10-CM | POA: Diagnosis present

## 2023-11-13 DIAGNOSIS — Z981 Arthrodesis status: Secondary | ICD-10-CM

## 2023-11-13 DIAGNOSIS — S32012D Unstable burst fracture of first lumbar vertebra, subsequent encounter for fracture with routine healing: Secondary | ICD-10-CM
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
## 2023-11-13 NOTE — Telephone Encounter (Signed)
 Orders placed.

## 2023-11-28 ENCOUNTER — Telehealth: Payer: Self-pay | Admitting: Physician Assistant

## 2023-11-28 NOTE — Telephone Encounter (Signed)
 Patient is approximately 2 months status post T11-L3 posterior spinal instrumentation and fusion with fracture reduction.  She called inquiring about using a tanning bed postoperatively.  I advised the patient that she should keep her incision out of direct sunlight for a minimum of 3 months.  Advised her to continue this outside of this period of time as well due to increased scarring risk.  She states that her back continues to be in pain, she states that this is not new pain, numbness or tingling.  I advised that this is not uncommon 2 months postoperatively of the surgery that she underwent.  Asked her to keep [place] updated if anything changes and we be happy to help.

## 2023-12-04 ENCOUNTER — Other Ambulatory Visit: Payer: Self-pay | Admitting: Internal Medicine

## 2023-12-04 DIAGNOSIS — Z1231 Encounter for screening mammogram for malignant neoplasm of breast: Secondary | ICD-10-CM

## 2023-12-06 ENCOUNTER — Encounter: Payer: Self-pay | Admitting: Neurosurgery

## 2023-12-11 ENCOUNTER — Ambulatory Visit
Admission: RE | Admit: 2023-12-11 | Discharge: 2023-12-11 | Disposition: A | Discharge status: Home / Self Care | Source: Ambulatory Visit | Attending: Neurosurgery | Admitting: Neurosurgery

## 2023-12-11 DIAGNOSIS — D329 Benign neoplasm of meninges, unspecified: Secondary | ICD-10-CM | POA: Diagnosis present

## 2023-12-11 MED ORDER — GADOBUTROL 1 MMOL/ML IV SOLN
6.0000 mL | Freq: Once | INTRAVENOUS | Status: AC | PRN
  Administered 2023-12-11: 6 mL via INTRAVENOUS

## 2023-12-12 ENCOUNTER — Encounter: Payer: Self-pay | Admitting: Neurosurgery

## 2023-12-19 ENCOUNTER — Other Ambulatory Visit: Payer: Self-pay | Admitting: Family Medicine

## 2023-12-19 DIAGNOSIS — S32012S Unstable burst fracture of first lumbar vertebra, sequela: Secondary | ICD-10-CM

## 2023-12-19 NOTE — Addendum Note (Signed)
 Addended by: Anise Kerns on: 12/19/2023 04:25 PM   Modules accepted: Orders

## 2023-12-20 ENCOUNTER — Ambulatory Visit
Admission: RE | Admit: 2023-12-20 | Discharge: 2023-12-20 | Disposition: A | Discharge status: Home / Self Care | Source: Ambulatory Visit | Attending: Neurosurgery | Admitting: Neurosurgery

## 2023-12-20 ENCOUNTER — Ambulatory Visit (INDEPENDENT_AMBULATORY_CARE_PROVIDER_SITE_OTHER): Payer: Managed Care, Other (non HMO) | Admitting: Physician Assistant

## 2023-12-20 ENCOUNTER — Encounter: Payer: Self-pay | Admitting: Physician Assistant

## 2023-12-20 VITALS — BP 118/78 | Temp 98.1°F | Ht 64.0 in | Wt 150.0 lb

## 2023-12-20 DIAGNOSIS — S32012S Unstable burst fracture of first lumbar vertebra, sequela: Secondary | ICD-10-CM | POA: Diagnosis present

## 2023-12-20 DIAGNOSIS — F321 Major depressive disorder, single episode, moderate: Secondary | ICD-10-CM

## 2023-12-20 DIAGNOSIS — S32012D Unstable burst fracture of first lumbar vertebra, subsequent encounter for fracture with routine healing: Secondary | ICD-10-CM
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
## 2024-01-02 ENCOUNTER — Encounter: Payer: Self-pay | Admitting: Neurosurgery

## 2024-01-18 ENCOUNTER — Ambulatory Visit: Attending: Otolaryngology

## 2024-01-18 DIAGNOSIS — R400 Somnolence: Secondary | ICD-10-CM | POA: Diagnosis present

## 2024-01-18 DIAGNOSIS — G4730 Sleep apnea, unspecified: Secondary | ICD-10-CM | POA: Diagnosis present

## 2024-01-18 DIAGNOSIS — R0683 Snoring: Secondary | ICD-10-CM | POA: Insufficient documentation

## 2024-01-19 ENCOUNTER — Telehealth: Payer: Self-pay | Admitting: Acute Care

## 2024-01-19 NOTE — Telephone Encounter (Signed)
 post op T11-L3 posterior spinal fusion, L1 transpedicular decompression on 10/02/23   Patient is calling to find out if she can have massage to try and relieve the pain that she has been having all week. She states that the pain is not near her incision but over to the left side. Please advise.

## 2024-02-14 ENCOUNTER — Ambulatory Visit
Admission: RE | Admit: 2024-02-14 | Discharge: 2024-02-14 | Disposition: A | Discharge status: Home / Self Care | Source: Ambulatory Visit | Attending: Internal Medicine | Admitting: Internal Medicine

## 2024-02-14 DIAGNOSIS — Z1231 Encounter for screening mammogram for malignant neoplasm of breast: Secondary | ICD-10-CM | POA: Insufficient documentation

## 2024-03-19 ENCOUNTER — Emergency Department

## 2024-03-19 ENCOUNTER — Emergency Department
Admission: EM | Admit: 2024-03-19 | Discharge: 2024-03-19 | Disposition: A | Discharge status: Home / Self Care | Source: Ambulatory Visit | Attending: Emergency Medicine | Admitting: Emergency Medicine

## 2024-03-19 ENCOUNTER — Other Ambulatory Visit: Payer: Self-pay

## 2024-03-19 DIAGNOSIS — Z853 Personal history of malignant neoplasm of breast: Secondary | ICD-10-CM | POA: Insufficient documentation

## 2024-03-19 DIAGNOSIS — I10 Essential (primary) hypertension: Secondary | ICD-10-CM | POA: Insufficient documentation

## 2024-03-19 DIAGNOSIS — K5900 Constipation, unspecified: Secondary | ICD-10-CM | POA: Insufficient documentation

## 2024-03-19 DIAGNOSIS — E119 Type 2 diabetes mellitus without complications: Secondary | ICD-10-CM | POA: Insufficient documentation

## 2024-03-19 DIAGNOSIS — G8929 Other chronic pain: Secondary | ICD-10-CM | POA: Insufficient documentation

## 2024-03-19 DIAGNOSIS — M545 Low back pain, unspecified: Secondary | ICD-10-CM | POA: Diagnosis not present

## 2024-03-19 DIAGNOSIS — R1031 Right lower quadrant pain: Secondary | ICD-10-CM | POA: Diagnosis present

## 2024-03-19 LAB — COMPREHENSIVE METABOLIC PANEL WITH GFR
ALT: 19 U/L (ref 0–44)
AST: 23 U/L (ref 15–41)
Albumin: 4.8 g/dL (ref 3.5–5.0)
Alkaline Phosphatase: 111 U/L (ref 38–126)
Anion gap: 10 (ref 5–15)
BUN: 13 mg/dL (ref 8–23)
CO2: 25 mmol/L (ref 22–32)
Calcium: 9.8 mg/dL (ref 8.9–10.3)
Chloride: 104 mmol/L (ref 98–111)
Creatinine, Ser: 0.43 mg/dL — ABNORMAL LOW (ref 0.44–1.00)
GFR, Estimated: >60 mL/min (ref >60)
Glucose, Bld: 137 mg/dL — ABNORMAL HIGH (ref 70–99)
Potassium: 3.7 mmol/L (ref 3.5–5.1)
Sodium: 139 mmol/L (ref 135–145)
Total Bilirubin: 0.8 mg/dL (ref 0.0–1.2)
Total Protein: 7.7 g/dL (ref 6.5–8.1)

## 2024-03-19 LAB — CBC WITH DIFFERENTIAL/PLATELET
Abs Immature Granulocytes: 0.03 K/uL (ref 0.00–0.07)
Basophils Absolute: 0 K/uL (ref 0.0–0.1)
Basophils Relative: 0 %
Eosinophils Absolute: 0.3 K/uL (ref 0.0–0.5)
Eosinophils Relative: 4 %
HCT: 40.6 % (ref 36.0–46.0)
Hemoglobin: 13.4 g/dL (ref 12.0–15.0)
Immature Granulocytes: 0 %
Lymphocytes Relative: 21 %
Lymphs Abs: 1.6 K/uL (ref 0.7–4.0)
MCH: 27.2 pg (ref 26.0–34.0)
MCHC: 33 g/dL (ref 30.0–36.0)
MCV: 82.4 fL (ref 80.0–100.0)
Monocytes Absolute: 0.6 K/uL (ref 0.1–1.0)
Monocytes Relative: 7 %
Neutro Abs: 5.2 K/uL (ref 1.7–7.7)
Neutrophils Relative %: 68 %
Platelets: 239 K/uL (ref 150–400)
RBC: 4.93 MIL/uL (ref 3.87–5.11)
RDW: 16 % — ABNORMAL HIGH (ref 11.5–15.5)
WBC: 7.7 K/uL (ref 4.0–10.5)
nRBC: 0 % (ref 0.0–0.2)

## 2024-03-19 LAB — URINALYSIS, W/ REFLEX TO CULTURE (INFECTION SUSPECTED)
Bacteria, UA: NONE SEEN
Bilirubin Urine: NEGATIVE
Glucose, UA: NEGATIVE mg/dL
Hgb urine dipstick: NEGATIVE
Ketones, ur: NEGATIVE mg/dL
Leukocytes,Ua: NEGATIVE
Nitrite: NEGATIVE
Protein, ur: NEGATIVE mg/dL
RBC / HPF: 0 RBC/hpf (ref 0–5)
Specific Gravity, Urine: 1.044 — ABNORMAL HIGH (ref 1.005–1.030)
pH: 7 (ref 5.0–8.0)

## 2024-03-19 MED ORDER — IOHEXOL 300 MG/ML  SOLN
100.0000 mL | Freq: Once | INTRAMUSCULAR | Status: AC | PRN
  Administered 2024-03-19: 100 mL via INTRAVENOUS

## 2024-03-19 MED ORDER — DOCUSATE SODIUM 100 MG PO CAPS: 100.0000 mg | ORAL_CAPSULE | Freq: Every day | ORAL | 2 refills | Status: AC | PRN

## 2024-03-19 MED ORDER — DICLOFENAC SODIUM 1 % EX GEL: 2.0000 g | Freq: Four times a day (QID) | CUTANEOUS | 0 refills | Status: DC

## 2024-03-19 MED ORDER — FLEET ENEMA RE ENEM: 1.0000 | ENEMA | Freq: Once | RECTAL | 0 refills | Status: AC

## 2024-03-19 MED ORDER — POLYETHYLENE GLYCOL 3350 17 G PO PACK: 17.0000 g | PACK | Freq: Every day | ORAL | 0 refills | Status: AC

## 2024-03-19 MED ORDER — LIDOCAINE 5 % EX PTCH: 1.0000 | MEDICATED_PATCH | CUTANEOUS | 0 refills | Status: AC
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
## 2024-03-19 NOTE — Discharge Instructions (Addendum)
 You were seen in the emergency department today for constipation.  Please pick up these medications.   1)  Miralax  (powder):  This medication works by drawing additional fluid into your intestines and helps to flush out your stool.  Mix the powder with water  or juice according to label instructions.  Be sure to use the recommended amount of water  or juice when you mix up the powder.  Plenty of fluids will help to prevent constipation. 2)  Colace (or Dulcolax) 100 mg:  This is a stool softener, and you may take it once or twice a day as needed. 3) Enema which you insert into your rectum.  You hold it in place and is dissolves and softens your stool and stimulates your bowels.  You could also consider using an enema, which is also available over the counter.  If these do not work, you can also purchase these over the counter: 4)  Senna tablets:  This is a bowel stimulant that will help push out your stool. It is the next step to add after you have tried a stool softener. 5)  Magnesium citrate: This is typically sold in a clear glass bottle also purchased without the need for prescription.  You can drink the bottle and it typically stimulates your bowels within a short period of time.  Drink plenty of fluids.  Please return to the Emergency Department immediately if you develop new or worsening symptoms that concern you, such as (but not limited to) fever > 101 degrees, severe abdominal pain, or persistent vomiting.   I have also provided you with lidocaine  patches and Voltaren  gel that can help your back pain.  Please return to the emergency department if you develop a fever, bowel or bladder incontinence, or any numbness or tingling in your bowel or bladder area.

## 2024-03-20 ENCOUNTER — Telehealth: Payer: Self-pay | Admitting: Neurosurgery

## 2024-03-20 NOTE — Telephone Encounter (Signed)
 Patient called the office and I read her the MyChart message. Patient is scheduled for a follow up on 03/26/2024.

## 2024-03-20 NOTE — Telephone Encounter (Signed)
 Please see below message and advise.     Media Information   Document Information  AMB Correspondence  NEUROSURGERY ANSWERING SERVICE  03/20/2024 08:33  Attached To:  Dagoberto LITTIE Cotton  Source Information  Default, Provider, MD

## 2024-03-26 ENCOUNTER — Ambulatory Visit: Admitting: Physician Assistant

## 2024-03-26 ENCOUNTER — Encounter: Payer: Self-pay | Admitting: Physician Assistant

## 2024-03-26 VITALS — BP 108/66 | Ht 64.0 in | Wt 144.0 lb

## 2024-03-26 DIAGNOSIS — M549 Dorsalgia, unspecified: Secondary | ICD-10-CM | POA: Diagnosis not present

## 2024-03-26 DIAGNOSIS — K5903 Drug induced constipation: Secondary | ICD-10-CM | POA: Diagnosis not present

## 2024-03-26 DIAGNOSIS — Z981 Arthrodesis status: Secondary | ICD-10-CM

## 2024-03-26 DIAGNOSIS — M75101 Unspecified rotator cuff tear or rupture of right shoulder, not specified as traumatic: Secondary | ICD-10-CM | POA: Diagnosis not present

## 2024-03-26 MED ORDER — CELECOXIB 200 MG PO CAPS
200.0000 mg | ORAL_CAPSULE | Freq: Every day | ORAL | 2 refills | Status: AC
Start: 2024-03-26 — End: 2025-03-26
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
## 2024-04-02 ENCOUNTER — Other Ambulatory Visit: Payer: Self-pay | Admitting: Physician Assistant

## 2024-04-02 MED ORDER — LIDOCAINE 5 % EX PTCH: 1.0000 | MEDICATED_PATCH | CUTANEOUS | 0 refills | Status: AC

## 2024-05-06 ENCOUNTER — Other Ambulatory Visit: Payer: Self-pay | Admitting: Physician Assistant

## 2024-05-06 DIAGNOSIS — Z981 Arthrodesis status: Secondary | ICD-10-CM

## 2024-10-02 ENCOUNTER — Ambulatory Visit: Admission: RE | Admit: 2024-10-02 | Discharge: 2024-10-02 | Disposition: A | Source: Ambulatory Visit

## 2024-10-02 ENCOUNTER — Other Ambulatory Visit: Payer: Self-pay

## 2024-10-02 DIAGNOSIS — R10A3 Flank pain, bilateral: Secondary | ICD-10-CM
